# Patient Record
Sex: Male | Born: 1970 | Race: Black or African American | Hispanic: No | Marital: Married | Smoking: Never smoker
Health system: Southern US, Community
[De-identification: ages and names within clinical notes are randomized; demographics above are authoritative.]

## PROBLEM LIST (undated history)

## (undated) DIAGNOSIS — I10 Essential (primary) hypertension: Secondary | ICD-10-CM

## (undated) HISTORY — PX: HAND SURGERY: SHX662

## (undated) HISTORY — PX: VASECTOMY: SHX75

---

## 2016-10-30 ENCOUNTER — Encounter: Payer: Self-pay | Admitting: Emergency Medicine

## 2016-10-30 ENCOUNTER — Emergency Department
Admission: EM | Admit: 2016-10-30 | Discharge: 2016-10-30 | Disposition: A | Discharge status: Home / Self Care | Payer: BLUE CROSS/BLUE SHIELD | Attending: Emergency Medicine | Admitting: Emergency Medicine

## 2016-10-30 DIAGNOSIS — J111 Influenza due to unidentified influenza virus with other respiratory manifestations: Secondary | ICD-10-CM

## 2016-10-30 DIAGNOSIS — J09X2 Influenza due to identified novel influenza A virus with other respiratory manifestations: Secondary | ICD-10-CM | POA: Insufficient documentation

## 2016-10-30 DIAGNOSIS — I10 Essential (primary) hypertension: Secondary | ICD-10-CM | POA: Insufficient documentation

## 2016-10-30 DIAGNOSIS — R51 Headache: Secondary | ICD-10-CM | POA: Diagnosis present

## 2016-10-30 HISTORY — DX: Essential (primary) hypertension: I10

## 2016-10-30 LAB — INFLUENZA PANEL BY PCR (TYPE A & B)
INFLAPCR: NEGATIVE
INFLBPCR: POSITIVE — AB

## 2016-10-30 MED ORDER — OSELTAMIVIR PHOSPHATE 75 MG PO CAPS: 75.0000 mg | ORAL_CAPSULE | Freq: Two times a day (BID) | ORAL | 0 refills | Status: AC

## 2016-10-30 NOTE — ED Notes (Addendum)
Pt reports several days of fever,nonproductive cough, body aches and headache; OTC meds providing little relief; lungs clear to auscultation

## 2016-10-30 NOTE — ED Triage Notes (Signed)
Pt reports fever intermittent over past few days with chills, cough over past couple days, HA as well.  Pt reports taking various OTC medications for sx with limited relief.  Pt NAD at this time.

## 2016-10-30 NOTE — ED Provider Notes (Signed)
[facility] Emergency Department Provider Note  ____________________________________________   First MD Initiated Contact with Patient 10/30/16 2201     (approximate)  I have reviewed the triage vital signs and the nursing notes.   HISTORY  Chief Complaint Fever and Cough    HPI Victor [name] is a 45 y.o. male presenting with acute onset of  headache, myalgias, malaise and cough for the past two days. Additional symptoms include fever as high as 101 assessed orally. No nausea or vomiting. Patient has been around sick contacts at work. No recent travel. Patient has been eating and drinking well. Denies diarrhea or constipation.Patient's wife has been giving him Tylenol as needed for fever.   Past Medical History:  Diagnosis Date  . Hypertension     There are no active problems to display for this patient.   History reviewed. No pertinent surgical history.  Prior to Admission medications   Medication Sig Start Date End Date Taking? Authorizing Provider  oseltamivir (TAMIFLU) 75 MG capsule Take 1 capsule (75 mg total) by mouth 2 (two) times daily. 10/30/16 11/04/16  Orvil FeilJaclyn M Sabri Teal, PA-C    Allergies Patient has no known allergies.  History reviewed. No pertinent family history.  Social History Social History  Substance Use Topics  . Smoking status: Never Smoker  . Smokeless tobacco: Never Used  . Alcohol use Yes    Review of Systems Constitutional: Has fever, malaise and myalgias Eyes: No visual changes. ENT: No sore throat. Had headache and congestion.  Cardiovascular: Denies chest pain. Respiratory: Denies shortness of breath. Gastrointestinal: No abdominal pain.  No nausea, no vomiting.  No diarrhea. No constipation. Genitourinary: Negative for dysuria. Skin: Negative for rash. Neurological: Negative for focal weakness or numbness.  10-point ROS otherwise  negative.  ____________________________________________   PHYSICAL EXAM:  VITAL SIGNS: ED Triage Vitals  Enc Vitals Group     BP 10/30/16 2127 (!) 160/97     Pulse Rate 10/30/16 2127 94     Resp 10/30/16 2127 18     Temp 10/30/16 2127 99.9 F (37.7 C)     Temp Source 10/30/16 2127 Oral     SpO2 10/30/16 2127 95 %     Weight 10/30/16 2127 196 lb (88.9 kg)     Height 10/30/16 2127 5\' 9"  (1.753 m)     Head Circumference --      Peak Flow --      Pain Score 10/30/16 2135 6     Pain Loc --      Pain Edu? --      Excl. in GC? --     Constitutional: Patient is lying supine on the bed. He appears fatigued. Eyes: Conjunctivae are normal. PERRL. EOMI. Head: Atraumatic. Nose: Nasal septum midline with copious rhinorrhea visualized. Mouth/Throat: Mucous membranes are moist.  Oropharynx non-erythematous. Neck: FROM. Hematological/Lymphatic/Immunilogical: No cervical lymphadenopathy. Cardiovascular: Normal rate, regular rhythm. Grossly normal heart sounds.  Good peripheral circulation. Respiratory: Normal respiratory effort.  No retractions. Lungs CTAB. Gastrointestinal: Soft and nontender. No distention. No abdominal bruits. No CVA tenderness. Musculoskeletal: No lower extremity tenderness nor edema.  No joint effusions. Neurologic:  Normal speech and language. No gross focal neurologic deficits are appreciated. No gait instability. Skin:  Skin is warm, dry and intact. No rash noted.  ____________________________________________   LABS (all labs ordered are listed, but only abnormal results are displayed)  Labs Reviewed  INFLUENZA PANEL BY PCR (TYPE A & B, H1N1) - Abnormal; Notable for the following:  Result Value   Influenza B By PCR POSITIVE (*)    All other components within normal limits     Procedures None   ____________________________________________   INITIAL IMPRESSION / ASSESSMENT AND PLAN / ED COURSE  Pertinent labs & imaging results that were  available during my care of the patient were reviewed by me and considered in my medical decision making (see chart for details).    Clinical Course    Assessment and plan: Patient tested positive for influenza A in the emergency department tonight. Tamiflu was prescribed discharge. Patient education was provided regarding the course of influenza. Rest and hydration were encouraged. Tylenol was recommended to be used as needed for fever. All patient questions were answered. Strict return precautions were given.  ____________________________________________   FINAL CLINICAL IMPRESSION(S) / ED DIAGNOSES  Final diagnoses:  Influenza      NEW MEDICATIONS STARTED DURING THIS VISIT:  Discharge Medication List as of 10/30/2016 10:36 PM    START taking these medications   Details  oseltamivir (TAMIFLU) 75 MG capsule Take 1 capsule (75 mg total) by mouth 2 (two) times daily., Starting Sat 10/30/2016, Until Thu 11/04/2016, Print         Note:  This document was prepared using Dragon voice recognition software and may include unintentional dictation errors.    Orvil FeilJaclyn M Harnoor Reta, PA-C 10/30/16 2311    Sharman CheekPhillip Stafford, MD 11/04/16 [phone]

## 2020-11-26 ENCOUNTER — Ambulatory Visit (INDEPENDENT_AMBULATORY_CARE_PROVIDER_SITE_OTHER): Payer: Commercial Managed Care - PPO | Admitting: "Endocrinology

## 2020-11-26 ENCOUNTER — Other Ambulatory Visit: Payer: Self-pay

## 2020-11-26 ENCOUNTER — Encounter: Payer: Self-pay | Admitting: "Endocrinology

## 2020-11-26 VITALS — BP 120/83 | HR 76 | Ht 68.0 in | Wt 212.4 lb

## 2020-11-26 DIAGNOSIS — E221 Hyperprolactinemia: Secondary | ICD-10-CM | POA: Diagnosis not present

## 2020-11-26 NOTE — Progress Notes (Signed)
Endocrinology Consult Note                                            11/26/2020, 4:00 PM   Subjective:    Patient ID: Victor [name], male    DOB: 1971-04-23, PCP Sasser, Clarene Critchley, MD   Past Medical History:  Diagnosis Date  . Hypertension   . Hypertension    Past Surgical History:  Procedure Laterality Date  . HAND SURGERY     R Hand  . VASECTOMY     Social History   Socioeconomic History  . Marital status: Single    Spouse name: Not on file  . Number of children: Not on file  . Years of education: Not on file  . Highest education level: Not on file  Occupational History  . Not on file  Tobacco Use  . Smoking status: Never Smoker  . Smokeless tobacco: Never Used  Vaping Use  . Vaping Use: Never used  Substance and Sexual Activity  . Alcohol use: Yes    Comment: Occassional  . Drug use: Never  . Sexual activity: Not on file  Other Topics Concern  . Not on file  Social History Narrative  . Not on file   Social Determinants of Health   Financial Resource Strain: Not on file  Food Insecurity: Not on file  Transportation Needs: Not on file  Physical Activity: Not on file  Stress: Not on file  Social Connections: Not on file   Family History  Problem Relation Age of Onset  . Anemia Father    Outpatient Encounter Medications as of 11/26/2020  Medication Sig  . amLODipine (NORVASC) 10 MG tablet Take 10 mg by mouth daily.  . pantoprazole (PROTONIX) 40 MG tablet Take 40 mg by mouth daily.   No facility-administered encounter medications on file as of 11/26/2020.   ALLERGIES: No Known Allergies  VACCINATION STATUS:  There is no immunization history on file for this patient.  HPI Victor [name] is 50 y.o. male who presents today with a medical history as above. he is being seen in consultation for hyperprolactinemia requested by Estanislado Pandy, MD.  He has no new complaints today.  He did have history of mild hypogonadism in the  past.  Work-up at 1 point showed slightly above target prolactin.  His most recent labs also show elevated prolactin of 23.8 (normal 4-15 0.2). He denies any visual field deficits, headaches.  He did not have brain/pituitary imaging.  Has no history of thyroid dysfunction.  He is taking amlodipine for hypertension.  He has never taken antidepressants For seizures.  Review of Systems  Constitutional: + Slightly fluctuating body weight,  no fatigue, no subjective hyperthermia, no subjective hypothermia Eyes: no blurry vision, no xerophthalmia ENT: no sore throat, no nodules palpated in throat, no dysphagia/odynophagia, no hoarseness Cardiovascular: no Chest Pain, no Shortness of Breath, no palpitations, no leg swelling Respiratory: no cough, no shortness of breath Gastrointestinal: no Nausea/Vomiting/Diarhhea Musculoskeletal: no muscle/joint aches Skin: no rashes Neurological: no tremors, no numbness, no tingling, no dizziness Psychiatric: no depression, no anxiety  Objective:    Vitals with BMI 11/26/2020 10/30/2016 10/30/2016  Height 5\' 8"  - 5\' 9"   Weight 212 lbs 6 oz - 196 lbs  BMI 32.3 - 29  Systolic 120 139  Diastolic 83 88 97  Pulse 76 96 94    BP 120/83   Pulse 76   Ht 5\' 8"  (1.727 m)   Wt 212 lb 6.4 oz (96.3 kg)   BMI 32.30 kg/m   Wt Readings from Last 3 Encounters:  11/26/20 212 lb 6.4 oz (96.3 kg)  10/30/16 196 lb (88.9 kg)    Physical Exam  Constitutional:  Body mass index is 32.3 kg/m.,  not in acute distress, normal state of mind Eyes: PERRLA, EOMI, no exophthalmos ENT: moist mucous membranes, no gross thyromegaly, no gross cervical lymphadenopathy  Musculoskeletal: no gross deformities, strength intact in all four extremities Skin: moist, warm, no rashes Neurological: no tremor with outstretched hands, Deep tendon reflexes normal in bilateral lower extremities.   November 13, 2020 labs: Prolactin 23.8-normal 4.0-15.2 ng/mL Total testosterone  345-normal 264-916   Assessment & Plan:   1. Hyperprolactinemia (HCC)   - [name]  is being seen at a kind request of Sasser, Irma Newness, MD. - I have reviewed his available endocrine records and clinically evaluated the patient. - Based on these reviews, he has hyperprolactinemia,  however,  there is not sufficient information to proceed with definitive treatment plan.  - he will need a repeat prolactin, TSH/free T4 towards confirming the diagnosis.  -If he is found to have sustained hyperprolactinemia, he will be considered for MRI sella/pituitary in preparation for intervention with cabergoline or bromocriptine. -If he returns with mild hyperprolactinemia, he will be kept on observation since he has no symptoms.  - I did not initiate any new prescriptions today. - he is advised to maintain close follow up with Sasser, Clarene Critchley, MD for primary care needs.   - Time spent with the patient: 45 minutes, of which >50% was spent in  counseling him about his hyperprolactinemia and the rest in obtaining information about his symptoms, reviewing his previous labs/studies ( including abstractions from other facilities),  evaluations, and treatments,  and developing a plan to confirm diagnosis and long term treatment based on the latest standards of care/guidelines; and documenting his care.  Clarene Critchley participated in the discussions, expressed understanding, and voiced agreement with the above plans.  All questions were answered to his satisfaction. he is encouraged to contact clinic should he have any questions or concerns prior to his return visit.  Follow up plan: Return in about 3 weeks (around 12/17/2020) for Fasting Labs  in AM B4 8.   02/14/2021, MD [facility] [address] [place], [place] Phone: [phone]  Fax: [phone]     11/26/2020, 4:00 PM  This note was partially dictated with voice  recognition software. Similar sounding words can be transcribed inadequately or may not  be corrected upon review.

## 2020-11-27 ENCOUNTER — Ambulatory Visit: Payer: BLUE CROSS/BLUE SHIELD | Admitting: "Endocrinology

## 2020-12-09 LAB — T4, FREE: Free T4: 1.37 ng/dL (ref 0.82–1.77)

## 2020-12-09 LAB — PROLACTIN: Prolactin: 22 ng/mL — ABNORMAL HIGH (ref 4.0–15.2)

## 2020-12-09 LAB — TSH: TSH: 2.64 u[IU]/mL (ref 0.450–4.500)

## 2020-12-17 ENCOUNTER — Ambulatory Visit (INDEPENDENT_AMBULATORY_CARE_PROVIDER_SITE_OTHER): Payer: Commercial Managed Care - PPO | Admitting: "Endocrinology

## 2020-12-17 ENCOUNTER — Encounter: Payer: Self-pay | Admitting: "Endocrinology

## 2020-12-17 ENCOUNTER — Other Ambulatory Visit: Payer: Self-pay

## 2020-12-17 VITALS — BP 120/78 | HR 88 | Ht 68.0 in | Wt 210.8 lb

## 2020-12-17 DIAGNOSIS — E221 Hyperprolactinemia: Secondary | ICD-10-CM | POA: Diagnosis not present

## 2020-12-17 NOTE — Progress Notes (Signed)
12/17/2020, 9:26 PM  Endocrinology follow-up note   Subjective:    Patient ID: Victor [name], male    DOB: 01-May-1971, PCP Sasser, Victor Critchley, MD   Past Medical History:  Diagnosis Date  . Hypertension   . Hypertension    Past Surgical History:  Procedure Laterality Date  . HAND SURGERY     R Hand  . VASECTOMY     Social History   Socioeconomic History  . Marital status: Single    Spouse name: Not on file  . Number of children: Not on file  . Years of education: Not on file  . Highest education level: Not on file  Occupational History  . Not on file  Tobacco Use  . Smoking status: Never Smoker  . Smokeless tobacco: Never Used  Vaping Use  . Vaping Use: Never used  Substance and Sexual Activity  . Alcohol use: Yes    Comment: Occassional  . Drug use: Never  . Sexual activity: Not on file  Other Topics Concern  . Not on file  Social History Narrative  . Not on file   Social Determinants of Health   Financial Resource Strain: Not on file  Food Insecurity: Not on file  Transportation Needs: Not on file  Physical Activity: Not on file  Stress: Not on file  Social Connections: Not on file   Family History  Problem Relation Age of Onset  . Anemia Father    Outpatient Encounter Medications as of 12/17/2020  Medication Sig  . amLODipine (NORVASC) 10 MG tablet Take 10 mg by mouth daily.  . pantoprazole (PROTONIX) 40 MG tablet Take 40 mg by mouth daily.   No facility-administered encounter medications on file as of 12/17/2020.   ALLERGIES: No Known Allergies  VACCINATION STATUS:  There is no immunization history on file for this patient.  HPI Juvon Ulysees Robarts is 50 y.o. male who presents today to follow-up after he was seen in consultation for hyperprolactinemia. PMD:  Estanislado Pandy, MD.  He has no new complaints today.  He did have history of mild hypogonadism in the past.  Work-up at 1 point  showed  above target prolactin , and another recent labs showed it at  23.8. (normal 4-15.2). He denies any visual field deficits, headaches.  He did not have brain/pituitary imaging. -No clear etiology was established, no suspect medications, no hypothyroidism.  His repeat labs also show high prolactin of 22.0.  He has never taken antidepressants nor antiseizures.  Patient denies visual changes nor headaches.   Review of Systems  Constitutional: + Slightly fluctuating body weight,  no fatigue, no subjective hyperthermia, no subjective hypothermia   Objective:    Vitals with BMI 12/17/2020 11/26/2020 10/30/2016  Height 5\' 8"  5\' 8"  -  Weight 210 lbs 13 oz 212 lbs 6 oz -  BMI 32.06 32.3 -  Systolic 120 120  Diastolic 78 83 88  Pulse 88 76 96    BP 120/78   Pulse 88   Ht 5\' 8"  (1.727 m)   Wt 210 lb 12.8 oz (95.6 kg)   BMI 32.05 kg/m   Wt Readings from Last 3 Encounters:  12/17/20 210 lb 12.8 oz (95.6 kg)  11/26/20 212  lb 6.4 oz (96.3 kg)  10/30/16 196 lb (88.9 kg)    Physical Exam  Constitutional:  Body mass index is 32.05 kg/m.,  not in acute distress, normal state of mind  November 13, 2020 labs: Prolactin 23.8-normal 4.0-15.2 ng/mL Total testosterone 345-normal 264-916 Recent Results (from the past 2160 hour(s))  Prolactin     Status: Abnormal   Collection Time: 12/08/20  8:16 AM  Result Value Ref Range   Prolactin 22.0 (H) 4.0 - 15.2 ng/mL  TSH     Status: None   Collection Time: 12/08/20  8:16 AM  Result Value Ref Range   TSH 2.640 0.450 - 4.500 uIU/mL  T4, free     Status: None   Collection Time: 12/08/20  8:16 AM  Result Value Ref Range   Free T4 1.37 0.82 - 1.77 ng/dL     Assessment & Plan:   1. Hyperprolactinemia (HCC) He continues to have hyperprolactinemia.  Confirmed now for 3 separate occasions.  No secondary causes established.  He is not taking any suspect medications, no thyroid dysfunction. -He will need intervention to lower prolactin with  dopamine agonists.  However, it is essential to obtain baseline MRI to rule out pituitary/sella  mass lesions. After a baseline MRI, he will still be considered for prevention with cabergoline.    - I did not initiate any new prescriptions today. - he is advised to maintain close follow up with Sasser, Victor Critchley, MD for primary care needs.    - Time spent on this patient care encounter:  20 minutes of which 50% was spent in  counseling and the rest reviewing  his current and  previous labs / studies and medications  doses and developing a plan for long term care. Fuller Plan  participated in the discussions, expressed understanding, and voiced agreement with the above plans.  All questions were answered to his satisfaction. he is encouraged to contact clinic should he have any questions or concerns prior to his return visit.   Follow up plan: Return in about 2 weeks (around 12/31/2020), or with MRI of Pituitary/ Sella.   [name], MD [facility] [address] [place], [place] [address] Phone: [phone]  Fax: [phone]     12/17/2020, 9:26 PM  This note was partially dictated with voice recognition software. Similar sounding words can be transcribed inadequately or may not  be corrected upon review.

## 2020-12-30 ENCOUNTER — Ambulatory Visit (HOSPITAL_COMMUNITY)
Admission: RE | Admit: 2020-12-30 | Discharge: 2020-12-30 | Disposition: A | Discharge status: Home / Self Care | Payer: Commercial Managed Care - PPO | Source: Ambulatory Visit | Attending: "Endocrinology | Admitting: "Endocrinology

## 2020-12-30 ENCOUNTER — Other Ambulatory Visit: Payer: Self-pay

## 2020-12-30 DIAGNOSIS — E221 Hyperprolactinemia: Secondary | ICD-10-CM | POA: Diagnosis not present

## 2020-12-30 MED ORDER — GADOBUTROL 1 MMOL/ML IV SOLN
10.0000 mL | Freq: Once | INTRAVENOUS | Status: AC | PRN
  Administered 2020-12-30: 10 mL via INTRAVENOUS

## 2020-12-31 ENCOUNTER — Ambulatory Visit: Payer: Commercial Managed Care - PPO | Admitting: "Endocrinology

## 2021-01-07 ENCOUNTER — Other Ambulatory Visit: Payer: Self-pay

## 2021-01-07 ENCOUNTER — Encounter: Payer: Self-pay | Admitting: "Endocrinology

## 2021-01-07 ENCOUNTER — Telehealth: Payer: Self-pay | Admitting: "Endocrinology

## 2021-01-07 ENCOUNTER — Ambulatory Visit (INDEPENDENT_AMBULATORY_CARE_PROVIDER_SITE_OTHER): Payer: Commercial Managed Care - PPO | Admitting: "Endocrinology

## 2021-01-07 VITALS — BP 138/90 | HR 84 | Ht 68.0 in | Wt 212.4 lb

## 2021-01-07 DIAGNOSIS — E221 Hyperprolactinemia: Secondary | ICD-10-CM | POA: Insufficient documentation

## 2021-01-07 MED ORDER — CABERGOLINE 0.5 MG PO TABS: 0.2500 mg | ORAL_TABLET | ORAL | 1 refills | Status: DC

## 2021-01-07 NOTE — Telephone Encounter (Signed)
Pt just left and said his prescription was sent to the wrong pharmacy it needs to [facility] to [facility] below  cabergoline (DOSTINEX) 0.5 MG tablet   [id number] [place], [place] - [address] Phone:  [phone]  Fax:  [phone]

## 2021-01-07 NOTE — Progress Notes (Signed)
01/07/2021, 4:42 PM  Endocrinology follow-up note   Subjective:    Patient ID: Victor [name], male    DOB: [birth year], PCP [name], Victor Critchley, MD   Past Medical History:  Diagnosis Date  . Hypertension   . Hypertension    Past Surgical History:  Procedure Laterality Date  . HAND SURGERY     R Hand  . VASECTOMY     Social History   Socioeconomic History  . Marital status: Married    Spouse name: Not on file  . Number of children: Not on file  . Years of education: Not on file  . Highest education level: Not on file  Occupational History  . Not on file  Tobacco Use  . Smoking status: Never Smoker  . Smokeless tobacco: Never Used  Vaping Use  . Vaping Use: Never used  Substance and Sexual Activity  . Alcohol use: Yes    Comment: Occassional  . Drug use: Never  . Sexual activity: Not on file  Other Topics Concern  . Not on file  Social History Narrative  . Not on file   Social Determinants of Health   Financial Resource Strain: Not on file  Food Insecurity: Not on file  Transportation Needs: Not on file  Physical Activity: Not on file  Stress: Not on file  Social Connections: Not on file   Family History  Problem Relation Age of Onset  . Anemia Father    Outpatient Encounter Medications as of 01/07/2021  Medication Sig  . [START ON 01/08/2021] cabergoline (DOSTINEX) 0.5 MG tablet Take 0.5 tablets (0.25 mg total) by mouth 2 (two) times a week.  Marland Kitchen amLODipine (NORVASC) 10 MG tablet Take 10 mg by mouth daily.  . pantoprazole (PROTONIX) 40 MG tablet Take 40 mg by mouth daily.   No facility-administered encounter medications on file as of 01/07/2021.   ALLERGIES: No Known Allergies  VACCINATION STATUS:  There is no immunization history on file for this patient.  HPI Victor [name] is 50 y.o. male who presents today to follow-up after he was seen in consultation for hyperprolactinemia. PMD:   Victor Pandy, MD.  He has no new complaints today.  He did have history of mild hypogonadism in the past.  Work-up at 1 point showed  above target prolactin , and another recent labs showed it at  23.8. (normal 4-15.2). He denies any visual field deficits, headaches.   His MRI did not show mass lesions in the pituitary/sella. -No clear etiology was established, no suspect medications, no hypothyroidism.  His repeat labs also show high prolactin of 22.0.  He has never taken antidepressants nor antiseizures.  Patient denies visual changes nor headaches.   Review of Systems  Constitutional: + Slightly fluctuating body weight,  no fatigue, no subjective hyperthermia, no subjective hypothermia   Objective:    Vitals with BMI 01/07/2021 12/17/2020 11/26/2020  Height 5\' 8"  5\' 8"  5\' 8"   Weight 212 lbs 6 oz 210 lbs 13 oz 212 lbs 6 oz  BMI 32.3 32.06 32.3  Systolic 138 120  Diastolic 90 78 83  Pulse 84 88 76    BP 138/90   Pulse 84   Ht 5\' 8"  (1.727 m)  Wt 212 lb 6.4 oz (96.3 kg)   BMI 32.30 kg/m   Wt Readings from Last 3 Encounters:  01/07/21 212 lb 6.4 oz (96.3 kg)  12/17/20 210 lb 12.8 oz (95.6 kg)  11/26/20 212 lb 6.4 oz (96.3 kg)    Physical Exam  Constitutional:  Body mass index is 32.3 kg/m.,  not in acute distress, normal state of mind  November 13, 2020 labs: Prolactin 23.8-normal 4.0-15.2 ng/mL Total testosterone 345-normal 264-916 Recent Results (from the past 2160 hour(s))  Prolactin     Status: Abnormal   Collection Time: 12/08/20  8:16 AM  Result Value Ref Range   Prolactin 22.0 (H) 4.0 - 15.2 ng/mL  TSH     Status: None   Collection Time: 12/08/20  8:16 AM  Result Value Ref Range   TSH 2.640 0.450 - 4.500 uIU/mL  T4, free     Status: None   Collection Time: 12/08/20  8:16 AM  Result Value Ref Range   Free T4 1.37 0.82 - 1.77 ng/dL     Assessment & [name]:   1. Hyperprolactinemia (HCC) He continues to have hyperprolactinemia.  Confirmed now for 3  separate occasions.  No secondary causes established.  He is not taking any suspect medications, no thyroid dysfunction. -His MRI is negative for mass lesions. He will need intervention to lower prolactin with dopamine agonists. I discussed and initiated cabergoline 0.25 mg twice a week with [name] to repeat prolactin and office visit in 3 months.  - I did not initiate any new prescriptions today. - he is advised to maintain close follow up with [name], Victor Critchley, MD for primary care needs.     - Time spent on this patient care encounter:  30 minutes of which 50% was spent in  counseling and the rest reviewing  his current and  previous labs / studies and medications  doses and developing a [name] for long term care, and documenting this care. Victor [name]  participated in the discussions, expressed understanding, and voiced agreement with the above plans.  All questions were answered to his satisfaction. he is encouraged to contact clinic should he have any questions or concerns prior to his return visit.   Follow up [name]: Return in about 3 months (around 04/09/2021) for F/U with Pre-visit Labs.   [name], MD [facility] [address] [place], [place] [address] Phone: [phone]  Fax: [phone]     01/07/2021, 4:42 PM  This note was partially dictated with voice recognition software. Similar sounding words can be transcribed inadequately or may not  be corrected upon review.

## 2021-01-08 ENCOUNTER — Other Ambulatory Visit: Payer: Self-pay | Admitting: "Endocrinology

## 2021-01-08 DIAGNOSIS — E221 Hyperprolactinemia: Secondary | ICD-10-CM

## 2021-01-08 MED ORDER — CABERGOLINE 0.5 MG PO TABS: 0.2500 mg | ORAL_TABLET | ORAL | 1 refills | Status: DC

## 2021-01-08 NOTE — Telephone Encounter (Signed)
Sent Rx 

## 2021-03-17 IMAGING — MR MR HEAD WO/W CM
18 of 20 series · 32 of 48 positions shown · IV contrast (10 ml Gadavist)
Comparison: None.

CLINICAL DATA: Elevated prolactin level.

EXAM:
MRI HEAD WITHOUT AND WITH CONTRAST
TECHNIQUE: Multiplanar, multiecho pulse sequences of the brain and surrounding
structures were obtained without and with intravenous contrast.
CONTRAST:  10mL GADAVIST GADOBUTROL 1 MMOL/ML IV SOLN

[Series 5: DWI · axial · 3.0mm · 0.77mm/px · z∈[-32,+103]mm · 2 of 44 slices shown (1 of 3)]
[im 1/44]
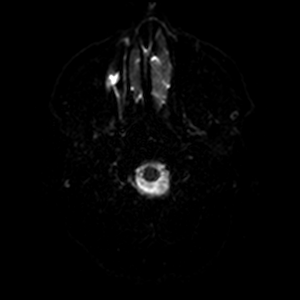
[im 44/44]
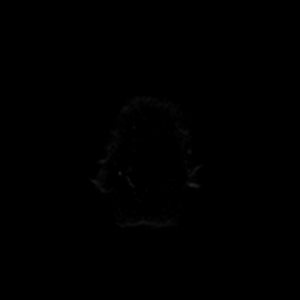

[Series 5: DWI · axial · 3.0mm · 0.77mm/px · z∈[-32,+103]mm · 2 of 44 slices shown (2 of 3)]
[im 1/44]
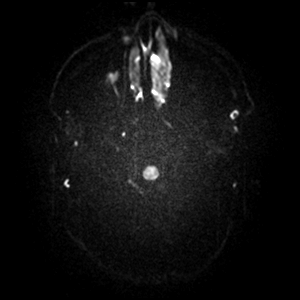
[im 44/44]
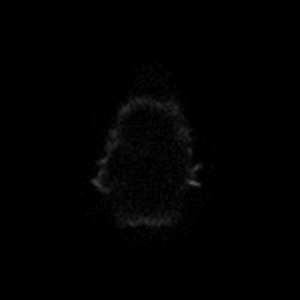

[Series 6: DWI · axial · 3.0mm · 0.77mm/px · z∈[-32,+103]mm · 3 of 44 slices shown (3 of 3)]
[im 1/44]
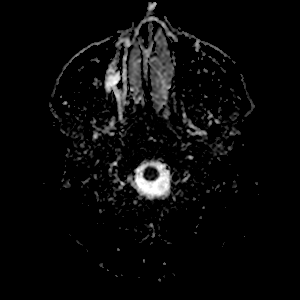
[im 22/44]
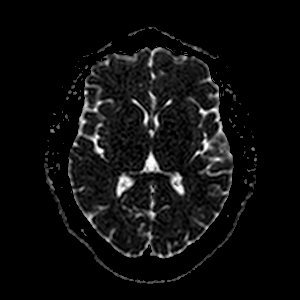
[im 44/44]
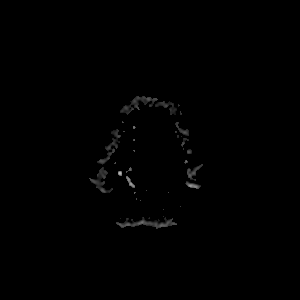

[Series 7: T1 · sagittal · 5.0mm · 0.75mm/px · 1 of 20 slices shown (1 of 3)]
[im 1/20]
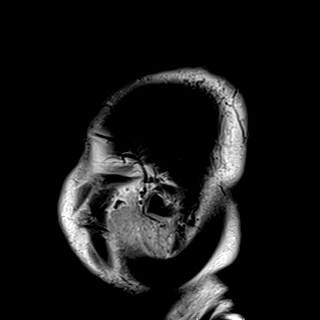

[Series 8: T2 · axial · 5.0mm · 0.72mm/px · 1 of 20 slices shown]
[im 1/20]
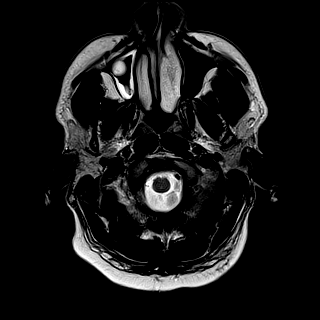

[Series 9: mag_images · axial · 3.0mm · 0.90mm/px · z∈[-52,+124]mm · 3 of 60 slices shown]
[im 1/60]
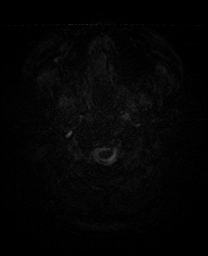
[im 30/60]
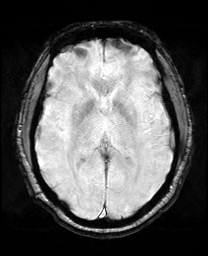
[im 60/60]
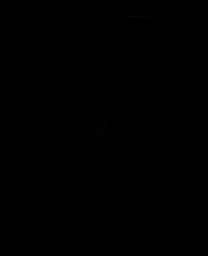

[Series 10: pha_images · axial · 3.0mm · 0.90mm/px · z∈[-52,+124]mm · 3 of 59 slices shown]
[im 1/59]
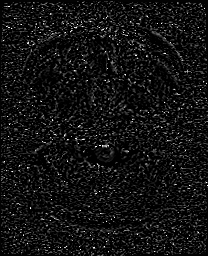
[im 30/59]
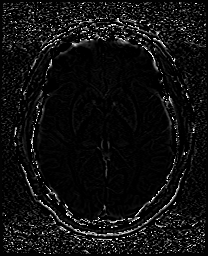
[im 59/59]
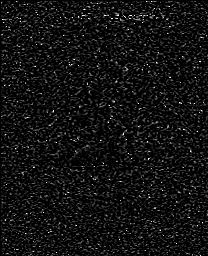

[Series 11: swi_images · axial · 3.0mm · 0.90mm/px · z∈[-52,+124]mm · 3 of 60 slices shown]
[im 1/60]
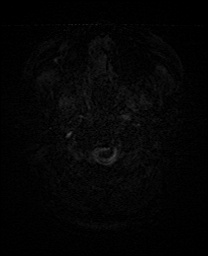
[im 30/60]
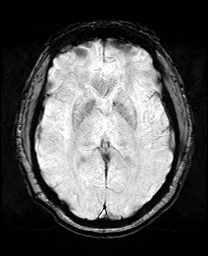
[im 60/60]
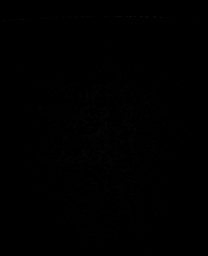

[Series 13: FLAIR · axial · 3.0mm · 0.45mm/px · z∈[-31,+103]mm · 2 of 40 slices shown]
[im 1/40]
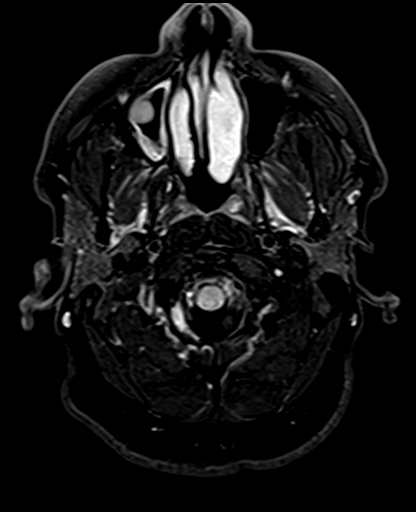
[im 40/40]
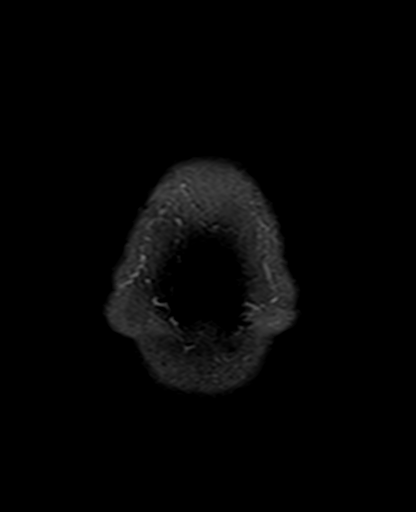

[Series 15: T1 · sagittal · 3.0mm · 0.25mm/px · 1 of 12 slices shown (2 of 3)]
[im 1/12]
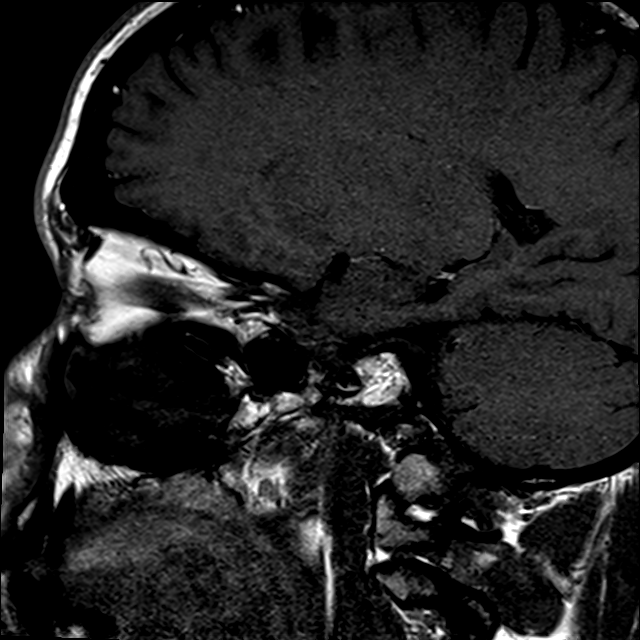

[Series 16: T1 · coronal · 3.0mm · 0.25mm/px · 1 of 13 slices shown (3 of 3)]
[im 1/13]
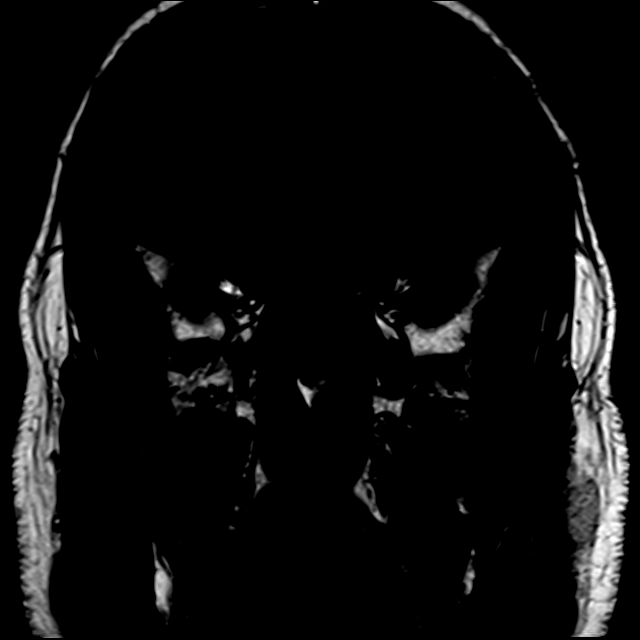

[Series 17: t1_tse_cor_dynamic pre · coronal · non-contrast · 3.0mm · 0.49mm/px · 1 of 5 slices shown]
[im 1/5]
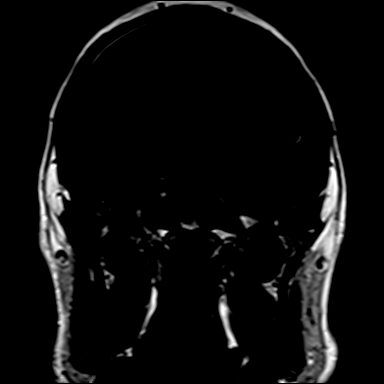

[Series 18: t1_tse_cor_dynamic post · coronal · 3.0mm · 0.49mm/px · 2 of 30 slices shown]
[im 1/30]
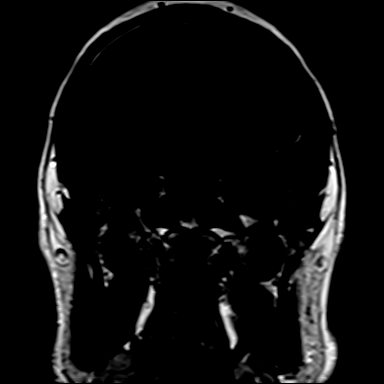
[im 30/30]
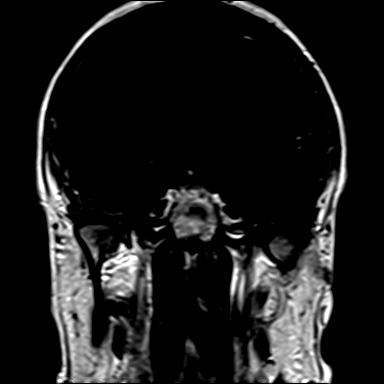

[Series 19: t1_tse_cor_dynamic post_sub · coronal · 3.0mm · 0.49mm/px · 1 of 24 slices shown]
[im 1/24]
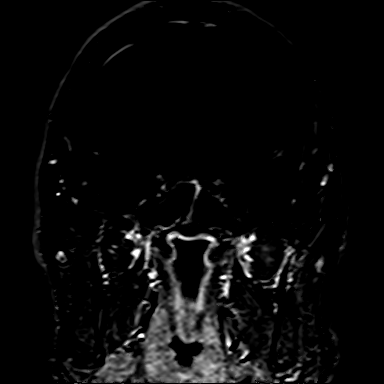

[Series 20: T2 post-contrast · coronal · 5.0mm · 0.72mm/px · 2 of 28 slices shown]
[im 1/28]
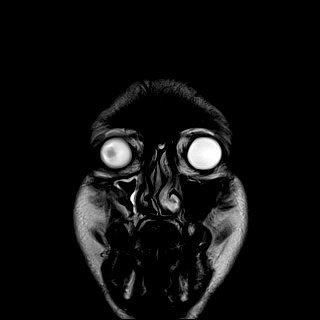
[im 28/28]
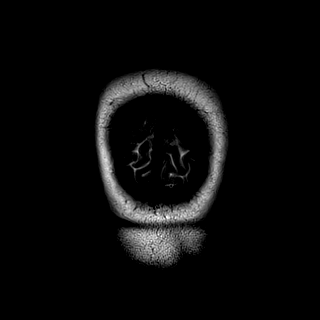

[Series 21: T1 post-contrast · sagittal · 3.0mm · 0.25mm/px · 1 of 12 slices shown (1 of 3)]
[im 1/12]
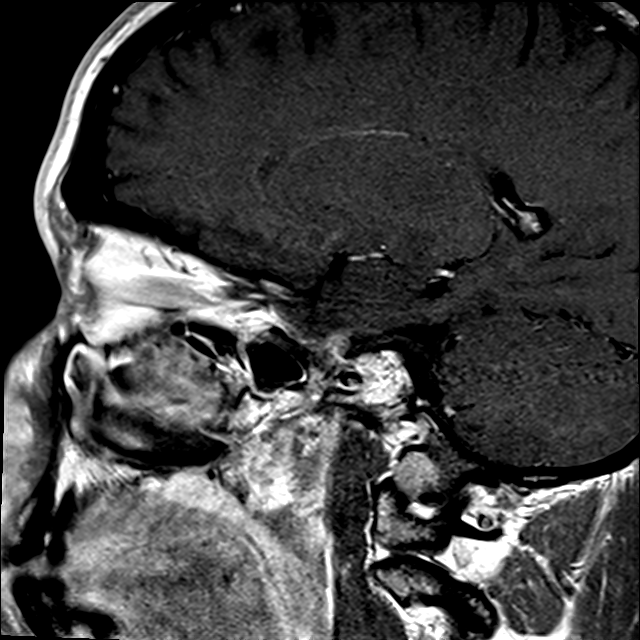

[Series 22: T1 post-contrast · coronal · 3.0mm · 0.25mm/px · 1 of 13 slices shown (2 of 3)]
[im 1/13]
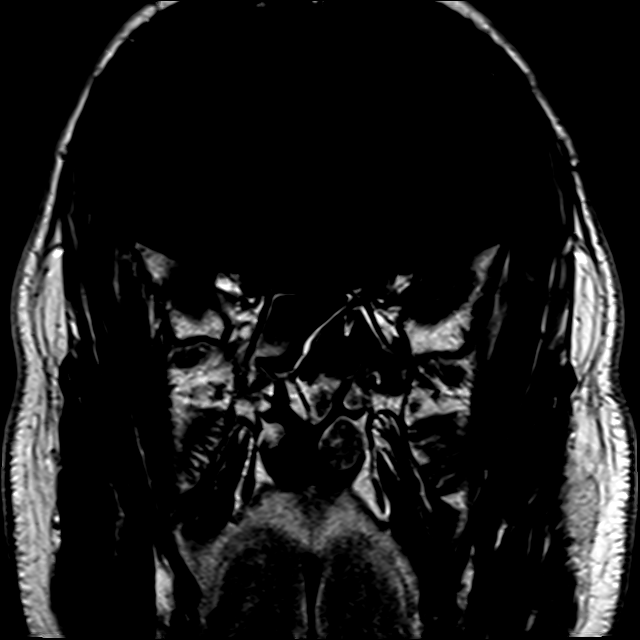

[Series 24: T1 post-contrast · coronal · 5.0mm · 0.34mm/px · 2 of 28 slices shown (3 of 3)]
[im 1/28]
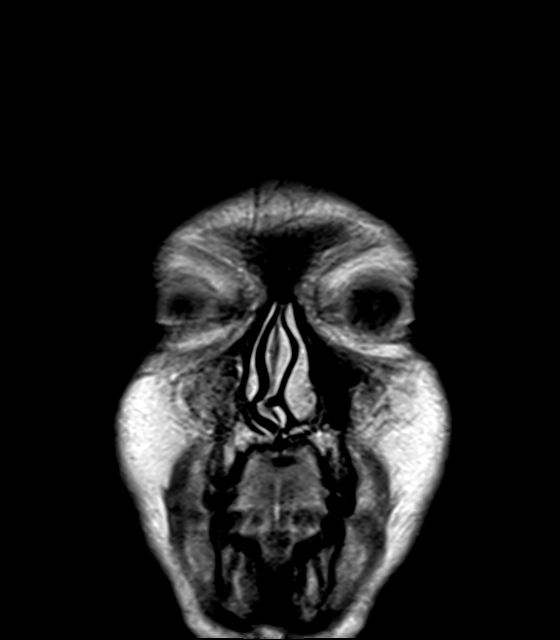
[im 28/28]
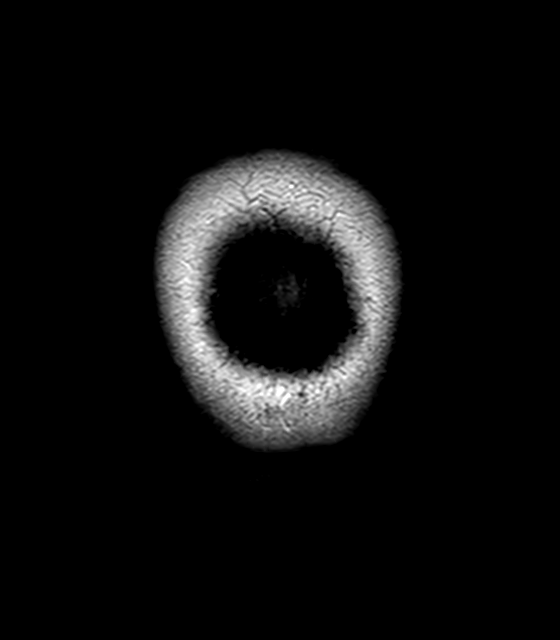

[32 of 48 positions shown; findings below may reference images not displayed]

FINDINGS: Brain: No acute infarction, hemorrhage, hydrocephalus, extra-axial
collection or mass lesion. There are a few T2/FLAIR hyperintense
white matter lesions, which are most likely secondary to chronic
microvascular ischemic disease and within normal limits for patient
age. No abnormal enhancement.

Pituitary protocol imaging was performed. The pituitary is within
normal limits in size. With dynamic contrast, no convincing discrete
areas of hypoenhancement to suggest pituitary microadenoma. The
infundibulum is midline and within normal limits.

Vascular: Major arterial flow voids are maintained at the skull
base.

Skull and upper cervical spine: Normal marrow signal.

Sinuses/Orbits: Mucosal thickening of right sphenoid and maxillary
sinuses and scattered ethmoid air cells. Inferior right maxillary
sinus mucous retention cyst. No air-fluid levels.

Other: No mastoid effusions.
IMPRESSION: 1. Unremarkable appearance of the pituitary without convincing
evidence of a pituitary microadenoma.
2. No acute intracranial abnormality.

## 2021-04-09 ENCOUNTER — Ambulatory Visit: Payer: Commercial Managed Care - PPO | Admitting: "Endocrinology

## 2021-04-14 LAB — PROLACTIN: Prolactin: 0.2 ng/mL — ABNORMAL LOW (ref 4.0–15.2)

## 2021-04-22 ENCOUNTER — Encounter: Payer: Self-pay | Admitting: "Endocrinology

## 2021-04-22 ENCOUNTER — Ambulatory Visit: Payer: BC Managed Care – PPO | Admitting: "Endocrinology

## 2021-04-22 ENCOUNTER — Other Ambulatory Visit: Payer: Self-pay

## 2021-04-22 VITALS — BP 135/87 | HR 71 | Ht 68.0 in | Wt 215.4 lb

## 2021-04-22 DIAGNOSIS — E221 Hyperprolactinemia: Secondary | ICD-10-CM | POA: Diagnosis not present

## 2021-04-22 MED ORDER — CABERGOLINE 0.5 MG PO TABS: 0.2500 mg | ORAL_TABLET | ORAL | 1 refills | Status: DC

## 2021-04-22 NOTE — Progress Notes (Signed)
04/22/2021, 5:33 PM  Endocrinology follow-up note   Subjective:    Patient ID: Victor [name], male    DOB: [birth year], PCP Sasser, Victor [name], Victor [name]   Past Medical History:  Diagnosis Date   Hypertension    Hypertension    Past Surgical History:  Procedure Laterality Date   HAND SURGERY     R Hand   VASECTOMY     Social History   Socioeconomic History   Marital status: Married    Spouse name: Not on file   Number of children: Not on file   Years of education: Not on file   Highest education level: Not on file  Occupational History   Not on file  Tobacco Use   Smoking status: Never   Smokeless tobacco: Never  Vaping Use   Vaping Use: Never used  Substance and Sexual Activity   Alcohol use: Yes    Comment: Occassional   Drug use: Never   Sexual activity: Not on file  Other Topics Concern   Not on file  Social History Narrative   Not on file   Social Determinants of Health   Financial Resource Strain: Not on file  Food Insecurity: Not on file  Transportation Needs: Not on file  Physical Activity: Not on file  Stress: Not on file  Social Connections: Not on file   Family History  Problem Relation Age of Onset   Anemia Father    Outpatient Encounter Medications as of 04/22/2021  Medication Sig   amLODipine (NORVASC) 10 MG tablet Take 10 mg by mouth daily.   cabergoline (DOSTINEX) 0.5 MG tablet Take 0.5 tablets (0.25 mg total) by mouth once a week.   pantoprazole (PROTONIX) 40 MG tablet Take 40 mg by mouth daily.   [DISCONTINUED] cabergoline (DOSTINEX) 0.5 MG tablet Take 0.5 tablets (0.25 mg total) by mouth 2 (two) times a week.   No facility-administered encounter medications on file as of 04/22/2021.   ALLERGIES: No Known Allergies  VACCINATION STATUS:  There is no immunization history on file for this patient.  HPI Victor [name] is 50 y.o. male who presents today to follow-up for  hyperprolactinemia.    PMD:  Victor [name], Victor [name]. during his last visit, he was put on cabergoline 0.25 mg twice weekly.  He continues to tolerate this medication.  He has no new complaints today.  His previsit labs are consistent with treatment effect. He did have history of mild hypogonadism in the past.  Work-up at 1 point showed  above target prolactin , and another recent labs showed it at  23.8. (normal 4-15.2).  Previsit labs show prolactin dropping to 0.2. He denies any visual field deficits, headaches.   His MRI did not show mass lesions in the pituitary/sella. -No clear etiology was established, no suspect medications, no hypothyroidism.  His repeat labs also show high prolactin of 22.0.  He has never taken antidepressants nor antiseizures.  Patient denies visual changes nor headaches.   Review of Systems  Constitutional: + Slightly fluctuating body weight,  no fatigue, no subjective hyperthermia, no subjective hypothermia   Objective:    Vitals with BMI 04/22/2021 01/07/2021 12/17/2020  Height 5\' 8"  5\' 8"  5\' 8"   Weight 215 lbs  6 oz 212 lbs 6 oz 210 lbs 13 oz  BMI 32.76 32.3 32.06  Systolic 135 138 865  Diastolic 87 90 78  Pulse 71 84 88    BP 135/87   Pulse 71   Ht 5\' 8"  (1.727 m)   Wt 215 lb 6.4 oz (97.7 kg)   BMI 32.75 kg/m   Wt Readings from Last 3 Encounters:  04/22/21 215 lb 6.4 oz (97.7 kg)  01/07/21 212 lb 6.4 oz (96.3 kg)  12/17/20 210 lb 12.8 oz (95.6 kg)    Physical Exam  Constitutional:  Body mass index is 32.75 kg/m.,  not in acute distress, normal state of mind  November 13, 2020 labs: Prolactin 23.8-normal 4.0-15.2 ng/mL Total testosterone 345-normal 264-916 Recent Results (from the past 2160 hour(s))  Prolactin     Status: Abnormal   Collection Time: 04/13/21  8:09 AM  Result Value Ref Range   Prolactin 0.2 (L) 4.0 - 15.2 ng/mL     Assessment & [name]:   1. Hyperprolactinemia (HCC) He presents with labs consistent with treatment effect.  He did  have history of hyperprolactinemia -  Confirmed now for 3 separate occasions.  No secondary causes established.  He is not taking any suspect medications, no thyroid dysfunction. -His MRI is negative for mass lesions.  I discussed and lowered his cabergoline to 0.25 mg once a week with [name] to repeat prolactin in 6 months.   I had a discussion with him about guidelines suggesting suppression of prolactin for 18 to 24 months before treatment withdrawal to avoid relapse.  - he is advised to maintain close follow up with Sasser, 06/13/21, Victor [name] for primary care needs.    I spent 25 minutes in the care of the patient today including review of labs from Thyroid Function, CMP, and other relevant labs ; imaging/biopsy records (current and previous including abstractions from other facilities); face-to-face time discussing  his lab results and symptoms, medications doses, his options of short and long term treatment based on the latest standards of care / guidelines;   and documenting the encounter.  Victor [name]  participated in the discussions, expressed understanding, and voiced agreement with the above plans.  All questions were answered to his satisfaction. he is encouraged to contact clinic should he have any questions or concerns prior to his return visit.   Follow up [name]: Return in about 6 months (around 10/22/2021) for F/U with Pre-visit Labs.   10/24/2021, Victor [name] [facility] [address] [place], [place] Phone: [phone]  Fax: [phone]     04/22/2021, 5:33 PM  This note was partially dictated with voice recognition software. Similar sounding words can be transcribed inadequately or may not  be corrected upon review.

## 2021-06-01 ENCOUNTER — Other Ambulatory Visit: Payer: Self-pay | Admitting: "Endocrinology

## 2021-06-01 DIAGNOSIS — E221 Hyperprolactinemia: Secondary | ICD-10-CM

## 2021-10-22 ENCOUNTER — Ambulatory Visit: Payer: BC Managed Care – PPO | Admitting: "Endocrinology

## 2021-11-19 LAB — PROLACTIN: Prolactin: 0.2 ng/mL — ABNORMAL LOW (ref 4.0–15.2)

## 2021-11-24 ENCOUNTER — Ambulatory Visit: Payer: BC Managed Care – PPO | Admitting: "Endocrinology

## 2021-11-24 ENCOUNTER — Encounter: Payer: Self-pay | Admitting: "Endocrinology

## 2021-11-24 ENCOUNTER — Other Ambulatory Visit: Payer: Self-pay

## 2021-11-24 VITALS — BP 126/88 | HR 68 | Ht 68.0 in | Wt 211.8 lb

## 2021-11-24 DIAGNOSIS — E221 Hyperprolactinemia: Secondary | ICD-10-CM

## 2021-11-24 NOTE — Progress Notes (Signed)
11/24/2021, 12:33 PM  Endocrinology follow-up note   Subjective:    Patient ID: Victor [name], male    DOB: [birth year], PCP Sasser, Clarene Critchley, MD   Past Medical History:  Diagnosis Date   Hypertension    Hypertension    Past Surgical History:  Procedure Laterality Date   HAND SURGERY     R Hand   VASECTOMY     Social History   Socioeconomic History   Marital status: Married    Spouse name: Not on file   Number of children: Not on file   Years of education: Not on file   Highest education level: Not on file  Occupational History   Not on file  Tobacco Use   Smoking status: Never   Smokeless tobacco: Never  Vaping Use   Vaping Use: Never used  Substance and Sexual Activity   Alcohol use: Yes    Comment: Occassional   Drug use: Never   Sexual activity: Not on file  Other Topics Concern   Not on file  Social History Narrative   Not on file   Social Determinants of Health   Financial Resource Strain: Not on file  Food Insecurity: Not on file  Transportation Needs: Not on file  Physical Activity: Not on file  Stress: Not on file  Social Connections: Not on file   Family History  Problem Relation Age of Onset   Anemia Father    Outpatient Encounter Medications as of 11/24/2021  Medication Sig   amLODipine (NORVASC) 10 MG tablet Take 10 mg by mouth daily.   cabergoline (DOSTINEX) 0.5 MG tablet TAKE 1/2 OF A TABLET (0.25MG  TOTAL) BY MOUTH ONCE A WEEK.   pantoprazole (PROTONIX) 40 MG tablet Take 40 mg by mouth daily.   No facility-administered encounter medications on file as of 11/24/2021.   ALLERGIES: No Known Allergies  VACCINATION STATUS:  There is no immunization history on file for this patient.  HPI Victor [name] is 51 y.o. male who presents today to follow-up for hyperprolactinemia.    PMD:  Victor Pandy, MD. during his last visit, he was put on cabergoline 0.25 mg twice  weekly.  He continues to tolerate this medication.  He has no new complaints today.  His previsit labs are consistent with treatment effect, with prolactin controlled at 0.2. He did have history of mild hypogonadism in the past. He denies any visual field deficits, headaches.   His MRI did not show mass lesions in the pituitary/sella. -No clear etiology was established, no suspect medications, no hypothyroidism.     He has never taken antidepressants nor antiseizures.  Patient denies visual changes nor headaches.   Review of Systems  Constitutional: + Slightly fluctuating body weight,  no fatigue, no subjective hyperthermia, no subjective hypothermia   Objective:    Vitals with BMI 11/24/2021 04/22/2021 01/07/2021  Height 5\' 8"  5\' 8"  5\' 8"   Weight 211 lbs 13 oz 215 lbs 6 oz 212 lbs 6 oz  BMI 32.21 32.76 32.3  Systolic 126 135  Diastolic 88 87 90  Pulse 68 71 84    BP 126/88    Pulse 68    Ht 5\' 8"  (1.727 m)    Wt  211 lb 12.8 oz (96.1 kg)    BMI 32.20 kg/m   Wt Readings from Last 3 Encounters:  11/24/21 211 lb 12.8 oz (96.1 kg)  04/22/21 215 lb 6.4 oz (97.7 kg)  01/07/21 212 lb 6.4 oz (96.3 kg)    Physical Exam  Constitutional:  Body mass index is 32.2 kg/m.,  not in acute distress, normal state of mind  November 13, 2020 labs: Prolactin 23.8-normal 4.0-15.2 ng/mL Total testosterone 345-normal 264-916 Recent Results (from the past 2160 hour(s))  Prolactin     Status: Abnormal   Collection Time: 11/18/21  8:15 AM  Result Value Ref Range   Prolactin 0.2 (L) 4.0 - 15.2 ng/mL     Assessment & [name]:   1. Hyperprolactinemia (HCC) He presents with labs consistent with treatment effect.  He did have history of hyperprolactinemia -  Confirmed now for 3 separate occasions.  No secondary causes established.  He is not taking any suspect medications, no thyroid dysfunction. -His MRI is negative for mass lesions.  He is response to cabergoline is satisfactory with control of  prolactin to 0.2 for 2 visits. He is advised to finish his current supplies of cabergoline 0.25 mg once a week and finish in the next 8 weeks and stop.  He will have repeat labs and return visit in 4 months.   If he presents with relapse of hyper prolactinemia, he will be resumed on Coumadin treatment.  - he is advised to maintain close follow up with Sasser, Clarene Critchley, MD for primary care needs.    I spent 21 minutes in the care of the patient today including review of labs from Thyroid Function, CMP, and other relevant labs ; imaging/biopsy records (current and previous including abstractions from other facilities); face-to-face time discussing  his lab results and symptoms, medications doses, his options of short and long term treatment based on the latest standards of care / guidelines;   and documenting the encounter.  Victor [name]  participated in the discussions, expressed understanding, and voiced agreement with the above plans.  All questions were answered to his satisfaction. he is encouraged to contact clinic should he have any questions or concerns prior to his return visit.   Follow up [name]: Return in about 4 months (around 03/24/2022) for F/U with Pre-visit Labs.   Victor Lunch, MD [facility] [address] [place], [place] [address] Phone: [phone]  Fax: [phone]     11/24/2021, 12:33 PM  This note was partially dictated with voice recognition software. Similar sounding words can be transcribed inadequately or may not  be corrected upon review.

## 2022-03-08 ENCOUNTER — Emergency Department (HOSPITAL_COMMUNITY): Payer: BC Managed Care – PPO

## 2022-03-08 ENCOUNTER — Emergency Department (HOSPITAL_COMMUNITY)
Admission: EM | Admit: 2022-03-08 | Discharge: 2022-03-08 | Disposition: A | Discharge status: Home / Self Care | Payer: BC Managed Care – PPO | Attending: Emergency Medicine | Admitting: Emergency Medicine

## 2022-03-08 ENCOUNTER — Encounter (HOSPITAL_COMMUNITY): Payer: Self-pay | Admitting: Emergency Medicine

## 2022-03-08 ENCOUNTER — Other Ambulatory Visit: Payer: Self-pay

## 2022-03-08 DIAGNOSIS — M7989 Other specified soft tissue disorders: Secondary | ICD-10-CM | POA: Insufficient documentation

## 2022-03-08 DIAGNOSIS — Z23 Encounter for immunization: Secondary | ICD-10-CM | POA: Insufficient documentation

## 2022-03-08 LAB — CBC WITH DIFFERENTIAL/PLATELET
Abs Immature Granulocytes: 0.02 10*3/uL (ref 0.00–0.07)
Basophils Absolute: 0 10*3/uL (ref 0.0–0.1)
Basophils Relative: 1 %
Eosinophils Absolute: 0.1 10*3/uL (ref 0.0–0.5)
Eosinophils Relative: 2 %
HCT: 44.9 % (ref 39.0–52.0)
Hemoglobin: 15.5 g/dL (ref 13.0–17.0)
Immature Granulocytes: 0 %
Lymphocytes Relative: 23 %
Lymphs Abs: 1.5 10*3/uL (ref 0.7–4.0)
MCH: 27.7 pg (ref 26.0–34.0)
MCHC: 34.5 g/dL (ref 30.0–36.0)
MCV: 80.3 fL (ref 80.0–100.0)
Monocytes Absolute: 0.5 10*3/uL (ref 0.1–1.0)
Monocytes Relative: 7 %
Neutro Abs: 4.5 10*3/uL (ref 1.7–7.7)
Neutrophils Relative %: 67 %
Platelets: 193 10*3/uL (ref 150–400)
RBC: 5.59 MIL/uL (ref 4.22–5.81)
RDW: 13.4 % (ref 11.5–15.5)
WBC: 6.7 10*3/uL (ref 4.0–10.5)
nRBC: 0 % (ref 0.0–0.2)

## 2022-03-08 LAB — I-STAT CHEM 8, ED
BUN: 16 mg/dL (ref 6–20)
Calcium, Ion: 1.14 mmol/L — ABNORMAL LOW (ref 1.15–1.40)
Chloride: 103 mmol/L (ref 98–111)
Creatinine, Ser: 1.3 mg/dL — ABNORMAL HIGH (ref 0.61–1.24)
Glucose, Bld: 111 mg/dL — ABNORMAL HIGH (ref 70–99)
HCT: 46 % (ref 39.0–52.0)
Hemoglobin: 15.6 g/dL (ref 13.0–17.0)
Potassium: 2.8 mmol/L — ABNORMAL LOW (ref 3.5–5.1)
Sodium: 140 mmol/L (ref 135–145)
TCO2: 25 mmol/L (ref 22–32)

## 2022-03-08 LAB — LACTIC ACID, PLASMA: Lactic Acid, Venous: 1 mmol/L (ref 0.5–1.9)

## 2022-03-08 MED ORDER — CLINDAMYCIN HCL 300 MG PO CAPS: 300.0000 mg | ORAL_CAPSULE | Freq: Three times a day (TID) | ORAL | 0 refills | Status: AC

## 2022-03-08 MED ORDER — HYDROCODONE-ACETAMINOPHEN 5-325 MG PO TABS
1.0000 | ORAL_TABLET | Freq: Once | ORAL | Status: AC
  Administered 2022-03-08: 1 via ORAL
  Filled 2022-03-08: qty 1

## 2022-03-08 MED ORDER — TETANUS-DIPHTH-ACELL PERTUSSIS 5-2.5-18.5 LF-MCG/0.5 IM SUSY
0.5000 mL | PREFILLED_SYRINGE | Freq: Once | INTRAMUSCULAR | Status: AC
  Administered 2022-03-08: 0.5 mL via INTRAMUSCULAR
  Filled 2022-03-08: qty 0.5

## 2022-03-08 MED ORDER — IBUPROFEN 200 MG PO TABS: 600.0000 mg | ORAL_TABLET | Freq: Four times a day (QID) | ORAL | Status: DC

## 2022-03-08 MED ORDER — LIDOCAINE HCL (PF) 1 % IJ SOLN
5.0000 mL | Freq: Once | INTRAMUSCULAR | Status: DC
  Filled 2022-03-08: qty 30

## 2022-03-08 MED ORDER — CLINDAMYCIN HCL 300 MG PO CAPS
300.0000 mg | ORAL_CAPSULE | Freq: Once | ORAL | Status: AC
  Administered 2022-03-08: 300 mg via ORAL
  Filled 2022-03-08: qty 1

## 2022-03-08 MED ORDER — ACETAMINOPHEN 325 MG PO TABS: 650.0000 mg | ORAL_TABLET | Freq: Four times a day (QID) | ORAL | Status: DC

## 2022-03-08 MED ORDER — CLINDAMYCIN HCL 300 MG PO CAPS: 300.0000 mg | ORAL_CAPSULE | Freq: Three times a day (TID) | ORAL | 0 refills | Status: DC

## 2022-03-08 NOTE — ED Triage Notes (Signed)
Patient c/o swelling and pain to L thumb. Seen at [facility] yesterday and started on Bactrim. Saw PCP today, got rocephin IM and referred to hand specialist. Swelling and pain worsening to thumb.  ?

## 2022-03-08 NOTE — Consult Note (Signed)
?ORTHOPAEDIC CONSULTATION HISTORY & PHYSICAL ?REQUESTING PHYSICIAN: Lennice Sites, DO ? ?Chief Complaint: Left thumb problem ? ?HPI: ?Victor [name] is a 51 y.o. male who has developed progressive swelling of the left thumb, which extended initially from a eponychial process.  He was evaluated at [facility] yesterday and placed on Bactrim.  He was provided Rocephin today at his PCP, and presented to the ED for further evaluation.  He reports no trauma to the digit, but that it started to develop this weekend.  He has photos revealing blistering of the eponychial fold that has progressed ? ?Past Medical History:  ?Diagnosis Date  ? Hypertension   ? Hypertension   ? ?Past Surgical History:  ?Procedure Laterality Date  ? HAND SURGERY    ? R Hand  ? VASECTOMY    ? ?Social History  ? ?Socioeconomic History  ? Marital status: Married  ?  Spouse name: Not on file  ? Number of children: Not on file  ? Years of education: Not on file  ? Highest education level: Not on file  ?Occupational History  ? Not on file  ?Tobacco Use  ? Smoking status: Never  ? Smokeless tobacco: Never  ?Vaping Use  ? Vaping Use: Never used  ?Substance and Sexual Activity  ? Alcohol use: Yes  ?  Comment: Occassional  ? Drug use: Never  ? Sexual activity: Not on file  ?Other Topics Concern  ? Not on file  ?Social History Narrative  ? Not on file  ? ?Social Determinants of Health  ? ?Financial Resource Strain: Not on file  ?Food Insecurity: Not on file  ?Transportation Needs: Not on file  ?Physical Activity: Not on file  ?Stress: Not on file  ?Social Connections: Not on file  ? ?Family History  ?Problem Relation Age of Onset  ? Anemia Father   ? ?No Known Allergies ?Prior to Admission medications   ?Medication Sig Start Date End Date Taking? Authorizing Provider  ?acetaminophen (TYLENOL) 325 MG tablet Take 2 tablets (650 mg total) by mouth every 6 (six) hours. 03/08/22  Yes Milly Jakob, MD  ?ibuprofen (ADVIL) 200 MG tablet Take 3 tablets (600 mg  total) by mouth every 6 (six) hours. 03/08/22  Yes Milly Jakob, MD  ?amLODipine (NORVASC) 10 MG tablet Take 10 mg by mouth daily.    [provider]  ?cabergoline (DOSTINEX) 0.5 MG tablet TAKE 1/2 OF A TABLET (0.25MG  TOTAL) BY MOUTH ONCE A WEEK. 06/02/21   Cassandria Anger, MD  ?pantoprazole (PROTONIX) 40 MG tablet Take 40 mg by mouth daily.    [provider]  ? ?DG Finger Thumb Left ? ?Result Date: 03/08/2022 ?CLINICAL DATA:  Infection of the thumb. EXAM: LEFT THUMB 2+V COMPARISON:  None. FINDINGS: There is diffuse soft tissue swelling of the thumb. There is no evidence for foreign body. No acute fracture or dislocation. No cortical erosions or periosteal reaction. Joint spaces are well maintained. IMPRESSION: 1. Diffuse soft tissue swelling of the thumb. 2. No acute bony abnormality. Electronically Signed   By: Ronney Asters M.D.   On: 03/08/2022 19:49   ? ?Positive ROS: All other systems have been reviewed and were otherwise negative with the exception of those mentioned in the HPI and as above. ? ?Physical Exam: ?Vitals: Refer to EMR. ?Constitutional:  WD, WN, NAD ?HEENT:  NCAT, EOMI ?Neuro/Psych:  Alert & oriented to person, place, and time; appropriate mood & affect ?Lymphatic: No generalized extremity edema or lymphadenopathy ?Extremities / MSK:  The  extremities are normal with respect to appearance, ranges of motion, joint stability, muscle strength/tone, sensation, & perfusion except as otherwise noted: ? ?The left thumb appears with a large oval blister on the dorsal and radial aspect.  See clinical photos in chart.  The remainder of the thumb appears very much normal.  The blister is tense and he reports pain from pressure.  MVI. ? ?Assessment: ?Left thumb blister, possibly infectious in origin from an eponychial process ? ?Plan/Procedure: ?I recommended drainage of the blister and debridement of the epidermal covering and he consented.  The blister was punctured first with an 11  blade.  The fluid was mostly thin and serous, with the little bit of floating flocculence within it.  Using scissors and forceps, the entire epidermal covering was removed.  There appeared to be no breach in the dermis, and certainly no expressible purulence from deep.  The dermis under the blister was somewhat red and injected.  This was cleansed with running water at the sink.  A dressing was applied consisting of Xeroform and gauze. ? ?In addition to his tetanus having been updated in the emergency department, he will receive initial antibiotics there.  I discussed this patient's care with Dr. Ronnald Nian, who will also send clindamycin to the patient's pharmacy.  I instructed the patient and his wife in wound care and my office will contact him tomorrow to arrange for a follow-up appointment on Thursday.  He was instructed to call should he clinically worsen in the interim. ? ?We discussed an analgesic plan consisting of ibuprofen and Tylenol.  Questions were invited and answered. ? ?Rayvon Char. Grandville Silos, MD      ?Orthopaedic & Hand Surgery ?[place] ?[address] ?[place], [place]  [address] ?Office: [phone] ?Mobile: [phone] ? ?03/08/2022, 9:59 PM  ? ? ?

## 2022-03-08 NOTE — ED Provider Triage Note (Signed)
Emergency Medicine Provider Triage Evaluation Note ? ?Craige Cotta , a 51 y.o. male  was evaluated in triage.  Pt complains of L thumb pain. Developed paronychia involving L thumb a few days ago, seen at [facility] and given Bactrim yesterday.  Increase pain and swelling today, can't bend thumb ? ?Review of Systems  ?Positive: Thumb pain ?Negative: Fever, trauma ? ?Physical Exam  ?BP (!) 160/97 (BP Location: Right Arm)   Pulse 92   Temp 98.6 ?F (37 ?C) (Oral)   Resp 16   SpO2 96%  ?Gen:   Awake, no distress   ?Resp:  Normal effort  ?MSK:   Moves extremities without difficulty  ?Other:  L thumb with large developing blister with pain to palpation ? ?Medical Decision Making  ?Medically screening exam initiated at 7:20 PM.  Appropriate orders placed.  Quintin Marlone Paradiso was informed that the remainder of the evaluation will be completed by another provider, this initial triage assessment does not replace that evaluation, and the importance of remaining in the ED until their evaluation is complete. ? ? ?  ?Domenic Moras, PA-C ?03/08/22 1925 ? ?

## 2022-03-08 NOTE — Discharge Instructions (Addendum)
Take next dose antibiotic tomorrow morning.  Recommend continued use of Tylenol and ibuprofen as needed for pain as well. ?

## 2022-03-08 NOTE — ED Provider Notes (Signed)
?[facility] ?Provider Note ? ? ?CSN: [id number] ?Arrival date & time: 03/08/22  1812 ? ?  ? ?History ? ?Chief Complaint  ?Patient presents with  ? Hand Pain  ? ? ?Victor [name] is a 51 y.o. male. ? ?Patient with pain and swelling to his left thumb for the last 2 to 3 days.  Denies any trauma.  States that he did trim his fingernails the other day.  Denies any fevers or chills.  Went to urgent care when he first noticed some swelling underneath the base of his fingernail was started on antibiotics.  Saw his primary care doctor today after swelling got significantly worse.  They tried to get him to a hand doctor but were unable to. ? ?The history is provided by the patient.  ?Hand Pain ?This is a new problem. The current episode started 2 days ago. The problem occurs constantly. The problem has not changed since onset.Pertinent negatives include no chest pain, no abdominal pain, no headaches and no shortness of breath. Nothing aggravates the symptoms. Nothing relieves the symptoms. He has tried nothing for the symptoms. The treatment provided no relief.  ? ?  ? ?Home Medications ?Prior to Admission medications   ?Medication Sig Start Date End Date Taking? Authorizing Provider  ?acetaminophen (TYLENOL) 325 MG tablet Take 2 tablets (650 mg total) by mouth every 6 (six) hours. 03/08/22  Yes Milly Jakob, MD  ?clindamycin (CLEOCIN) 300 MG capsule Take 1 capsule (300 mg total) by mouth 3 (three) times daily for 10 days. 03/08/22 03/18/22 Yes Cash Meadow, DO  ?ibuprofen (ADVIL) 200 MG tablet Take 3 tablets (600 mg total) by mouth every 6 (six) hours. 03/08/22  Yes Milly Jakob, MD  ?amLODipine (NORVASC) 10 MG tablet Take 10 mg by mouth daily.    [provider]  ?cabergoline (DOSTINEX) 0.5 MG tablet TAKE 1/2 OF A TABLET (0.25MG  TOTAL) BY MOUTH ONCE A WEEK. 06/02/21   Cassandria Anger, MD  ?pantoprazole (PROTONIX) 40 MG tablet Take 40 mg by mouth daily.    [provider]  ?   ? ?Allergies    ?Patient has no known allergies.   ? ?Review of Systems   ?Review of Systems  ?Respiratory:  Negative for shortness of breath.   ?Cardiovascular:  Negative for chest pain.  ?Gastrointestinal:  Negative for abdominal pain.  ?Neurological:  Negative for headaches.  ? ?Physical Exam ?Updated Vital Signs ?BP (!) 160/97 (BP Location: Right Arm)   Pulse 92   Temp 98.6 ?F (37 ?C) (Oral)   Resp 16   SpO2 96%  ?Physical Exam ?Constitutional:   ?   General: He is not in acute distress. ?   Appearance: He is not ill-appearing.  ?HENT:  ?   Head: Normocephalic.  ?   Mouth/Throat:  ?   Mouth: Mucous membranes are moist.  ?Cardiovascular:  ?   Pulses: Normal pulses.  ?Musculoskeletal:     ?   General: Swelling present.  ?Skin: ?   General: Skin is warm.  ?   Capillary Refill: Capillary refill takes less than 2 seconds.  ?   Comments: Has fairly large what appears to be blood blister to the left thumb consistent with fairly extensive paronychia, see media tab for photos  ?Neurological:  ?   General: No focal deficit present.  ?   Mental Status: He is alert.  ?   Sensory: No sensory deficit.  ?   Motor: No weakness.  ? ? ?  ED Results / Procedures / Treatments   ?Labs ?(all labs ordered are listed, but only abnormal results are displayed) ?Labs Reviewed  ?I-STAT CHEM 8, ED - Abnormal; Notable for the following components:  ?    Result Value  ? Potassium 2.8 (*)   ? Creatinine, Ser 1.30 (*)   ? Glucose, Bld 111 (*)   ? Calcium, Ion 1.14 (*)   ? All other components within normal limits  ?CBC WITH DIFFERENTIAL/PLATELET  ?LACTIC ACID, PLASMA  ? ? ?EKG ?None ? ?Radiology ?DG Finger Thumb Left ? ?Result Date: 03/08/2022 ?CLINICAL DATA:  Infection of the thumb. EXAM: LEFT THUMB 2+V COMPARISON:  None. FINDINGS: There is diffuse soft tissue swelling of the thumb. There is no evidence for foreign body. No acute fracture or dislocation. No cortical erosions or periosteal reaction. Joint spaces are well  maintained. IMPRESSION: 1. Diffuse soft tissue swelling of the thumb. 2. No acute bony abnormality. Electronically Signed   By: Ronney Asters M.D.   On: 03/08/2022 19:49   ? ?Procedures ?Procedures  ? ? ?Medications Ordered in ED ?Medications  ?lidocaine (PF) (XYLOCAINE) 1 % injection 5 mL (has no administration in time range)  ?clindamycin (CLEOCIN) capsule 300 mg (has no administration in time range)  ?Tdap (BOOSTRIX) injection 0.5 mL (0.5 mLs Intramuscular Given 03/08/22 2136)  ?HYDROcodone-acetaminophen (NORCO/VICODIN) 5-325 MG per tablet 1 tablet (1 tablet Oral Given 03/08/22 2139)  ? ? ?ED Course/ Medical Decision Making/ A&P ?  ?                        ?Medical Decision Making ?Risk ?Prescription drug management. ? ? ?Victor [name] is here with left thumb swelling.  Normal vitals.  No fever.  Lab work with CBC, i-STAT, x-ray obtained prior to my evaluation.  Per review there is no significant anemia, electrolyte abnormality, kidney injury.  X-ray per radiology report shows diffuse soft tissue swelling of the thumb.  Patient denies any trauma.  He has a photo of his finger from 2 days ago that showed may be a mild paronychia but now he has fairly diffuse swelling to the left thumb sort of looks like a blood blister fairly extensive.  No fever or chills.  Pictures were taken and placed in the media file.  Talked with Dr. Grandville Silos with hand team who came to the ED to evaluate the patient.  He unroofed the blister and there was a mixture of serosanguineous and purulent drainage.  This was wrapped.  We will switch him to clindamycin as patient was on Bactrim for the last several days.  He will follow-up outpatient with hand team.  Tetanus shot was updated.  Discharged in good condition. ? ?This chart was dictated using voice recognition software.  Despite best efforts to proofread,  errors can occur which can change the documentation meaning.  ? ? ? ? ? ? ? ?Final Clinical Impression(s) / ED Diagnoses ?Final  diagnoses:  ?Swelling of thumb, left  ? ? ?Rx / DC Orders ?ED Discharge Orders   ? ?      Ordered  ?  ibuprofen (ADVIL) 200 MG tablet  Every 6 hours       ? 03/08/22 2159  ?  acetaminophen (TYLENOL) 325 MG tablet  Every 6 hours       ? 03/08/22 2159  ?  clindamycin (CLEOCIN) 300 MG capsule  3 times daily       ? 03/08/22 2206  ? ?  ?  ? ?  ? ? ?  ?  Lennice Sites, DO ?03/08/22 2207 ? ?

## 2022-03-19 LAB — PROLACTIN: Prolactin: 17 ng/mL — ABNORMAL HIGH (ref 4.0–15.2)

## 2022-03-24 ENCOUNTER — Other Ambulatory Visit: Payer: Self-pay

## 2022-03-24 ENCOUNTER — Ambulatory Visit (INDEPENDENT_AMBULATORY_CARE_PROVIDER_SITE_OTHER): Payer: BC Managed Care – PPO | Admitting: "Endocrinology

## 2022-03-24 VITALS — BP 130/92 | HR 68 | Ht 68.0 in | Wt 213.4 lb

## 2022-03-24 DIAGNOSIS — E221 Hyperprolactinemia: Secondary | ICD-10-CM | POA: Diagnosis not present

## 2022-03-24 MED ORDER — CABERGOLINE 0.5 MG PO TABS: 0.2500 mg | ORAL_TABLET | ORAL | 1 refills | Status: DC

## 2022-03-24 NOTE — Progress Notes (Signed)
? ? ?                                           03/24/2022, 2:25 PM ? ?Endocrinology follow-up note ? ? ?Subjective:  ? ? Patient ID: Victor [name], male    DOB: 1971/01/17, PCP Sasser, Clarene Critchley, MD ? ? ?Past Medical History:  ?Diagnosis Date  ? Hypertension   ? Hypertension   ? ?Past Surgical History:  ?Procedure Laterality Date  ? HAND SURGERY    ? R Hand  ? VASECTOMY    ? ?Social History  ? ?Socioeconomic History  ? Marital status: Married  ?  Spouse name: Not on file  ? Number of children: Not on file  ? Years of education: Not on file  ? Highest education level: Not on file  ?Occupational History  ? Not on file  ?Tobacco Use  ? Smoking status: Never  ? Smokeless tobacco: Never  ?Vaping Use  ? Vaping Use: Never used  ?Substance and Sexual Activity  ? Alcohol use: Yes  ?  Comment: Occassional  ? Drug use: Never  ? Sexual activity: Not on file  ?Other Topics Concern  ? Not on file  ?Social History Narrative  ? Not on file  ? ?Social Determinants of Health  ? ?Financial Resource Strain: Not on file  ?Food Insecurity: Not on file  ?Transportation Needs: Not on file  ?Physical Activity: Not on file  ?Stress: Not on file  ?Social Connections: Not on file  ? ?Family History  ?Problem Relation Age of Onset  ? Anemia Father   ? ?Outpatient Encounter Medications as of 03/24/2022  ?Medication Sig  ? acetaminophen (TYLENOL) 325 MG tablet Take 2 tablets (650 mg total) by mouth every 6 (six) hours.  ? amLODipine (NORVASC) 10 MG tablet Take 10 mg by mouth daily.  ? [START ON 03/25/2022] cabergoline (DOSTINEX) 0.5 MG tablet Take 0.5 tablets (0.25 mg total) by mouth 2 (two) times a week. TAKE 1/2 OF A TABLET (0.25MG  TOTAL) BY MOUTH ONCE A WEEK.  ? ibuprofen (ADVIL) 200 MG tablet Take 3 tablets (600 mg total) by mouth every 6 (six) hours.  ? pantoprazole (PROTONIX) 40 MG tablet Take 40 mg by mouth daily as needed.  ? [DISCONTINUED] cabergoline (DOSTINEX) 0.5 MG tablet TAKE 1/2 OF A TABLET (0.25MG  TOTAL) BY MOUTH ONCE A  WEEK. (Patient not taking: Reported on 03/24/2022)  ? ?No facility-administered encounter medications on file as of 03/24/2022.  ? ?ALLERGIES: ?No Known Allergies ? ?VACCINATION STATUS: ?Immunization History  ?Administered Date(s) Administered  ? Tdap 03/08/2022  ? ? ?HPI ?Victor [name] is 51 y.o. male who presents today to follow-up for hyperprolactinemia.   ? ?PMD:  Estanislado Pandy, MD. during his last visit, he was put on cabergoline 0.25 mg twice weekly.  He responded to treatment with lowering of prolactin to undetectable levels for 2 visits back-to-back.  During his last visit he was advised to taper it off.  He has not taking any cabergoline for 1 month before he went for his lab which showed relapse of hyperprolactinemia to 17.   ? ?He did have history of mild hypogonadism in the past. He denies any visual field deficits, headaches.   ?His MRI did not show mass lesions in the pituitary/sella. ?-No clear etiology was established, no suspect medications, no hypothyroidism.   ? ? He has  never taken antidepressants nor antiseizures.  Patient denies visual changes nor headaches. ? ? ?Review of Systems ? ?Constitutional: + Slightly fluctuating body weight,  no fatigue, no subjective hyperthermia, no subjective hypothermia ? ? ?Objective:  ?  ? ?  03/24/2022  ?  9:17 AM 03/08/2022  ? 10:31 PM 03/08/2022  ?  7:03 PM  ?Vitals with BMI  ?Height 5\' 8"     ?Weight 213 lbs 6 oz    ?BMI 32.46    ?Systolic 130 156  ?Diastolic 92 94 97  ?Pulse 68 87 92  ? ? ?BP (!) 130/92   Pulse 68   Ht 5\' 8"  (1.727 m)   Wt 213 lb 6.4 oz (96.8 kg)   BMI 32.45 kg/m?   ?Wt Readings from Last 3 Encounters:  ?03/24/22 213 lb 6.4 oz (96.8 kg)  ?11/24/21 211 lb 12.8 oz (96.1 kg)  ?04/22/21 215 lb 6.4 oz (97.7 kg)  ?  ?Physical Exam ? ?Constitutional:  Body mass index is 32.45 kg/m?.,  not in acute distress, normal state of mind ? ?November 13, 2020 labs: ?Prolactin 23.8-normal 4.0-15.2 ng/mL ?Total testosterone 345-normal 264-916 ?Recent  Results (from the past 2160 hour(s))  ?CBC with Differential     Status: None  ? Collection Time: 03/08/22  7:59 PM  ?Result Value Ref Range  ? WBC 6.7 4.0 - 10.5 K/uL  ? RBC 5.59 4.22 - 5.81 MIL/uL  ? Hemoglobin 15.5 13.0 - 17.0 g/dL  ? HCT 44.9 39.0 - 52.0 %  ? MCV 80.3 80.0 - 100.0 fL  ? MCH 27.7 26.0 - 34.0 pg  ? MCHC 34.5 30.0 - 36.0 g/dL  ? RDW 13.4 11.5 - 15.5 %  ? Platelets 193 150 - 400 K/uL  ? nRBC 0.0 0.0 - 0.2 %  ? Neutrophils Relative % 67 %  ? Neutro Abs 4.5 1.7 - 7.7 K/uL  ? Lymphocytes Relative 23 %  ? Lymphs Abs 1.5 0.7 - 4.0 K/uL  ? Monocytes Relative 7 %  ? Monocytes Absolute 0.5 0.1 - 1.0 K/uL  ? Eosinophils Relative 2 %  ? Eosinophils Absolute 0.1 0.0 - 0.5 K/uL  ? Basophils Relative 1 %  ? Basophils Absolute 0.0 0.0 - 0.1 K/uL  ? Immature Granulocytes 0 %  ? Abs Immature Granulocytes 0.02 0.00 - 0.07 K/uL  ?  Comment: Performed at [facility], [address] [place], [place]  ?Lactic acid, plasma     Status: None  ? Collection Time: 03/08/22  7:59 PM  ?Result Value Ref Range  ? Lactic Acid, Venous 1.0 0.5 - 1.9 mmol/L  ?  Comment: Performed at [facility], [address], [place], [place]  ?I-stat chem 8, ED (not at [facility] or [facility])     Status: Abnormal  ? Collection Time: 03/08/22  8:04 PM  ?Result Value Ref Range  ? Sodium 140 135 - 145 mmol/L  ? Potassium 2.8 (L) 3.5 - 5.1 mmol/L  ? Chloride 103 98 - 111 mmol/L  ? BUN 16 6 - 20 mg/dL  ? Creatinine, Ser 1.30 (H) 0.61 - 1.24 mg/dL  ? Glucose, Bld 111 (H) 70 - 99 mg/dL  ?  Comment: Glucose reference range applies only to samples taken after fasting for at least 8 hours.  ? Calcium, Ion 1.14 (L) 1.15 - 1.40 mmol/L  ? TCO2 25 22 - 32 mmol/L  ? Hemoglobin 15.6 13.0 - 17.0 g/dL  ? HCT 46.0 39.0 - 52.0 %  ?  Prolactin     Status: Abnormal  ? Collection Time: 03/18/22  8:24 AM  ?Result Value Ref Range  ? Prolactin 17.0 (H) 4.0 - 15.2 ng/mL  ? ? ? ?Assessment & Plan:  ? ?1. Hyperprolactinemia  (HCC) ?He presents with labs consistent with relapse of hyperprolactinemia.  He has responded previously to low-dose cabergoline.  He is approached for repeat treatment with low-dose cabergoline 0.25 mg p.o. twice a week with plan to repeat prolactin in 6 months with office visit.   ? He is not taking any suspect medications, no thyroid dysfunction. ?-His MRI is negative for mass lesions.   ?This time, he will be kept on low-dose cabergoline for 18-8362-month to lower his risk of relapse. ? ?- he is advised to maintain close follow up with Sasser, Clarene CritchleyPaul W, MD for primary care needs. ? ? ?I spent 21 minutes in the care of the patient today including review of labs from Thyroid Function, CMP, and other relevant labs ; imaging/biopsy records (current and previous including abstractions from other facilities); face-to-face time discussing  his lab results and symptoms, medications doses, his options of short and long term treatment based on the latest standards of care / guidelines;   and documenting the encounter. ? ?Fuller Planesbee Tyrone Burkland  participated in the discussions, expressed understanding, and voiced agreement with the above plans.  All questions were answered to his satisfaction. he is encouraged to contact clinic should he have any questions or concerns prior to his return visit. ? ? ?Follow up plan: ?Return in about 6 months (around 09/24/2022) for F/U with Pre-visit Labs. ? ? ?Marquis LunchGebre Marianne Golightly, MD ?[facility] ?[facility] ?[address] ?[place], [place] [address] ?Phone: [phone]  Fax: [phone]   ? ? ?03/24/2022, 2:25 PM ? ?This note was partially dictated with voice recognition software. Similar sounding words can be transcribed inadequately or may not  be corrected upon review. ? ?

## 2022-09-01 ENCOUNTER — Other Ambulatory Visit: Payer: Self-pay | Admitting: "Endocrinology

## 2022-09-01 DIAGNOSIS — E221 Hyperprolactinemia: Secondary | ICD-10-CM

## 2022-09-22 LAB — PROLACTIN: Prolactin: 0.2 ng/mL — ABNORMAL LOW (ref 4.0–15.2)

## 2022-09-28 ENCOUNTER — Ambulatory Visit: Payer: BC Managed Care – PPO | Admitting: "Endocrinology

## 2022-09-28 ENCOUNTER — Encounter: Payer: Self-pay | Admitting: "Endocrinology

## 2022-09-28 VITALS — BP 144/88 | HR 72 | Ht 68.0 in | Wt 211.2 lb

## 2022-09-28 DIAGNOSIS — E221 Hyperprolactinemia: Secondary | ICD-10-CM

## 2022-09-28 NOTE — Progress Notes (Signed)
09/28/2022, 12:42 PM  Endocrinology follow-up note   Subjective:    Patient ID: Victor [name], male    DOB: 03/21/1971, PCP Sasser, Clarene Critchley, MD   Past Medical History:  Diagnosis Date   Hypertension    Hypertension    Past Surgical History:  Procedure Laterality Date   HAND SURGERY     R Hand   VASECTOMY     Social History   Socioeconomic History   Marital status: Married    Spouse name: Not on file   Number of children: Not on file   Years of education: Not on file   Highest education level: Not on file  Occupational History   Not on file  Tobacco Use   Smoking status: Never   Smokeless tobacco: Never  Vaping Use   Vaping Use: Never used  Substance and Sexual Activity   Alcohol use: Yes    Comment: Occassional   Drug use: Never   Sexual activity: Not on file  Other Topics Concern   Not on file  Social History Narrative   Not on file   Social Determinants of Health   Financial Resource Strain: Not on file  Food Insecurity: Not on file  Transportation Needs: Not on file  Physical Activity: Not on file  Stress: Not on file  Social Connections: Not on file   Family History  Problem Relation Age of Onset   Anemia Father    Outpatient Encounter Medications as of 09/28/2022  Medication Sig   amLODipine (NORVASC) 10 MG tablet Take 10 mg by mouth daily.   cabergoline (DOSTINEX) 0.5 MG tablet TAKE 1/2 OF A TABLET (0.25MG  TOTAL) BY MOUTH 2 TIMES A WEEK.   pantoprazole (PROTONIX) 40 MG tablet Take 40 mg by mouth daily as needed.   [DISCONTINUED] acetaminophen (TYLENOL) 325 MG tablet Take 2 tablets (650 mg total) by mouth every 6 (six) hours.   [DISCONTINUED] ibuprofen (ADVIL) 200 MG tablet Take 3 tablets (600 mg total) by mouth every 6 (six) hours.   No facility-administered encounter medications on file as of 09/28/2022.   ALLERGIES: No Known Allergies  VACCINATION STATUS: Immunization History   Administered Date(s) Administered   Tdap 03/08/2022    HPI` Victor [name] is 51 y.o. male who presents today to follow-up for hyperprolactinemia.    PMD:  Estanislado Pandy, MD. during his last visit, he was put on cabergoline 0.25 mg twice weekly.  He responded to treatment with lowering of prolactin to undetectable levels for 2 visits back-to-back. Will attempt to withdrawal cabergoline treatment resulted in increased prolactin at 17.  During his last visit he was put back on low-dose cabergoline 0.25 mg twice weekly.  He presents with controlled prolactin to 0.2.  He did have history of mild hypogonadism in the past. He denies any visual field deficits, headaches.   His MRI did not show mass lesions in the pituitary/sella. -No clear etiology was established, no suspect medications, no hypothyroidism.     He has never taken antidepressants nor antiseizures.  Patient denies visual changes nor headaches.   Review of Systems  Constitutional: + Slightly fluctuating body weight,  no fatigue, no subjective hyperthermia, no subjective hypothermia   Objective:  09/28/2022   10:02 AM 03/24/2022    9:17 AM 03/08/2022   10:31 PM  Vitals with BMI  Height 5\' 8"  5\' 8"    Weight 211 lbs 3 oz 213 lbs 6 oz   BMI 32.12 32.46   Systolic 144 130  Diastolic 88 92 94  Pulse 72 68 87    BP (!) 144/88   Pulse 72   Ht 5\' 8"  (1.727 m)   Wt 211 lb 3.2 oz (95.8 kg)   BMI 32.11 kg/m   Wt Readings from Last 3 Encounters:  09/28/22 211 lb 3.2 oz (95.8 kg)  03/24/22 213 lb 6.4 oz (96.8 kg)  11/24/21 211 lb 12.8 oz (96.1 kg)    Physical Exam  Constitutional:  Body mass index is 32.11 kg/m.,  not in acute distress, normal state of mind  November 13, 2020 labs: Prolactin 23.8-normal 4.0-15.2 ng/mL Total testosterone 345-normal 264-916 Recent Results (from the past 2160 hour(s))  Prolactin     Status: Abnormal   Collection Time: 09/21/22  8:07 AM  Result Value Ref Range    Prolactin 0.2 (L) 4.0 - 15.2 ng/mL     Assessment & [name]:   1. Hyperprolactinemia (HCC) He presents with labs consistent with treatment effect with controlled prolactin at 0.2.  He will be continued on treatment for total of 2 years before withdrawing to avoid relapse.  Hyperprolactinemia that he has experienced 1 time before.   He will need repeat prolactin measurement in 6 months at PCP office.  He is not taking any suspect medications, no thyroid dysfunction. -His MRI is negative for mass lesions.   He will be kept on low-dose cabergoline for 17-month to lower his risk of relapse.  - he is advised to maintain close follow up with Sasser, 2161, MD for primary care needs.  I spent 21 minutes in the care of the patient today including review of labs from Thyroid Function, CMP, and other relevant labs ; imaging/biopsy records (current and previous including abstractions from other facilities); face-to-face time discussing  his lab results and symptoms, medications doses, his options of short and long term treatment based on the latest standards of care / guidelines;   and documenting the encounter.  09/23/22  participated in the discussions, expressed understanding, and voiced agreement with the above plans.  All questions were answered to his satisfaction. he is encouraged to contact clinic should he have any questions or concerns prior to his return visit.   Follow up [name]: Return in about 1 year (around 09/29/2023) for F/U with Pre-visit Labs.   Clarene Critchley, MD [facility] [address] [place], [facility] [address] Phone: [phone]  Fax: [phone]     09/28/2022, 12:42 PM  This note was partially dictated with voice recognition software. Similar sounding words can be transcribed inadequately or may not  be corrected upon review.

## 2022-11-17 ENCOUNTER — Other Ambulatory Visit: Payer: Self-pay | Admitting: "Endocrinology

## 2022-11-17 DIAGNOSIS — E221 Hyperprolactinemia: Secondary | ICD-10-CM

## 2023-02-13 ENCOUNTER — Other Ambulatory Visit: Payer: Self-pay | Admitting: "Endocrinology

## 2023-02-13 DIAGNOSIS — E221 Hyperprolactinemia: Secondary | ICD-10-CM

## 2023-09-20 ENCOUNTER — Telehealth: Payer: Self-pay | Admitting: "Endocrinology

## 2023-09-20 ENCOUNTER — Other Ambulatory Visit: Payer: Self-pay | Admitting: *Deleted

## 2023-09-20 DIAGNOSIS — E221 Hyperprolactinemia: Secondary | ICD-10-CM

## 2023-09-20 NOTE — Telephone Encounter (Signed)
Pt needs labs updated for 1 year follow up

## 2023-09-20 NOTE — Telephone Encounter (Signed)
Lab has been updated.

## 2023-09-29 ENCOUNTER — Ambulatory Visit: Payer: BC Managed Care – PPO | Admitting: "Endocrinology

## 2023-11-18 LAB — PROLACTIN: Prolactin: 0.1 ng/mL — ABNORMAL LOW (ref 3.6–25.2)

## 2023-11-21 ENCOUNTER — Ambulatory Visit: Payer: 59 | Admitting: "Endocrinology

## 2023-11-23 ENCOUNTER — Ambulatory Visit: Payer: 59 | Admitting: "Endocrinology

## 2023-11-23 ENCOUNTER — Encounter: Payer: Self-pay | Admitting: "Endocrinology

## 2023-11-23 VITALS — BP 138/84 | HR 60 | Ht 69.0 in | Wt 213.2 lb

## 2023-11-23 DIAGNOSIS — E221 Hyperprolactinemia: Secondary | ICD-10-CM | POA: Diagnosis not present

## 2023-11-23 DIAGNOSIS — I10 Essential (primary) hypertension: Secondary | ICD-10-CM | POA: Diagnosis not present

## 2023-11-23 NOTE — Progress Notes (Signed)
 11/23/2023, 9:29 AM  Endocrinology follow-up note   Subjective:    Patient ID: Victor [name], male    DOB: 09/10/1971, PCP Sasser, Ky Phillips, MD   Past Medical History:  Diagnosis Date   Hypertension    Hypertension    Past Surgical History:  Procedure Laterality Date   HAND SURGERY     R Hand   VASECTOMY     Social History   Socioeconomic History   Marital status: Married    Spouse name: Not on file   Number of children: Not on file   Years of education: Not on file   Highest education level: Not on file  Occupational History   Not on file  Tobacco Use   Smoking status: Never   Smokeless tobacco: Never  Vaping Use   Vaping status: Never Used  Substance and Sexual Activity   Alcohol use: Yes    Comment: Occassional   Drug use: Never   Sexual activity: Not on file  Other Topics Concern   Not on file  Social History Narrative   Not on file   Social Drivers of Health   Financial Resource Strain: Not on file  Food Insecurity: Not on file  Transportation Needs: Not on file  Physical Activity: Not on file  Stress: Not on file  Social Connections: Not on file   Family History  Problem Relation Age of Onset   Anemia Father    Outpatient Encounter Medications as of 11/23/2023  Medication Sig   amLODipine (NORVASC) 10 MG tablet Take 10 mg by mouth daily.   cabergoline  (DOSTINEX ) 0.5 MG tablet Take 0.5 tablets (0.25 mg total) by mouth once a week.   pantoprazole (PROTONIX) 40 MG tablet Take 40 mg by mouth daily as needed.   [DISCONTINUED] cabergoline  (DOSTINEX ) 0.5 MG tablet TAKE 1/2 OF A TABLET (0.25MG  TOTAL) BY MOUTH 2 TIMES A WEEK.   No facility-administered encounter medications on file as of 11/23/2023.   ALLERGIES: No Known Allergies  VACCINATION STATUS: Immunization History  Administered Date(s) Administered   Tdap 03/08/2022    HPI` Victor [name] is 53 y.o. male who presents  today to follow-up for hyperprolactinemia.    PMD:  Orest Bio, MD. during his last visit, he was put on cabergoline  0.25 mg twice weekly.  He responded to treatment with lowering of prolactin to undetectable levels for 2 visits back-to-back. He was treated continuously with low-dose cabergoline  for almost 2 years with his most recent prolactin was 0.1 on November 17, 2023. A previous  attempt to withdraw cabergoline  treatment resulted in increased prolactin at 17.   He is currently on cabergoline  0.25 mg p.o. twice weekly.  He did have history of mild hypogonadism in the past. He denies any visual field deficits, headaches.   His MRI did not show mass lesions in the pituitary/sella. -No clear etiology was established, no suspect medications, no hypothyroidism.     He has never taken antidepressants nor antiseizures.  Patient denies visual changes nor headaches. His other medical problem is hypertension for which she is amlodipine 10 mg p.o. daily.  Review of Systems  Constitutional: + Slightly fluctuating body weight,  no fatigue, no subjective hyperthermia, no subjective hypothermia  Objective:       11/23/2023    9:20 AM 11/23/2023    9:06 AM 09/28/2022   10:02 AM  Vitals with BMI  Height  5\' 9"  5\' 8"   Weight  213 lbs 3 oz 211 lbs 3 oz  BMI  31.47 32.12  Systolic 138 150 161  Diastolic 84 82 88  Pulse  60 72    BP 138/84 Comment: L arm with manual cuff  Pulse 60   Ht 5\' 9"  (1.753 m)   Wt 213 lb 3.2 oz (96.7 kg)   BMI 31.48 kg/m   Wt Readings from Last 3 Encounters:  11/23/23 213 lb 3.2 oz (96.7 kg)  09/28/22 211 lb 3.2 oz (95.8 kg)  03/24/22 213 lb 6.4 oz (96.8 kg)    Physical Exam  Constitutional:  Body mass index is 31.48 kg/m.,  not in acute distress, normal state of mind  November 13, 2020 labs: Prolactin 23.8-normal 4.0-15.2 ng/mL Total testosterone  345-normal [id number] Recent Results (from the past 2160 hours)  Prolactin     Status: Abnormal   Collection  Time: 11/17/23  8:37 AM  Result Value Ref Range   Prolactin 0.1 (L) 3.6 - 25.2 ng/mL     Assessment & Plan:   1. Hyperprolactinemia (HCC)  2.  Hypertension He presents with labs consistent with treatment effect with controlled prolactin at 0.1.  He was treated continuously with cabergoline  for almost 2 years with adequate treatment response.  He wishes to be tapered off of treatment.  He is advised to lower his, Libre to 0.25 mg weekly until he finishes his current supplies.  He will need prolactin measurement in a year via his PCP and will return to clinic if needed.    -His MRI is negative for mass lesions.   She was found to have elevated blood pressure at 150/82, repeat showed 138/84.  He did have elevated blood pressure readings in 2 previous visits here 144/88, 130/92.  He is advised to follow-up with his PCP for better blood pressure management.  - he is advised to maintain close follow up with Sasser, Ky Phillips, MD for primary care needs.   I spent  22  minutes in the care of the patient today including review of labs from Thyroid Function, CMP, and other relevant labs ; imaging/biopsy records (current and previous including abstractions from other facilities); face-to-face time discussing  his lab results and symptoms, medications doses, his options of short and long term treatment based on the latest standards of care / guidelines;   and documenting the encounter.  Victor [name]  participated in the discussions, expressed understanding, and voiced agreement with the above plans.  All questions were answered to his satisfaction. he is encouraged to contact clinic should he have any questions or concerns prior to his return visit.   Follow up plan: Return if symptoms worsen or fail to improve.   Kalvin Orf, MD [facility] [address] [place], [place] [address] Phone: [phone]  Fax: [phone]     11/23/2023,  9:29 AM  This note was partially dictated with voice recognition software. Similar sounding words can be transcribed inadequately or may not  be corrected upon review.
